# Patient Record
Sex: Male | Born: 1965 | Race: White | Hispanic: No | Marital: Single | Smoking: Current every day smoker
Health system: Southern US, Community
[De-identification: ages and names within clinical notes are randomized; demographics above are authoritative.]

## PROBLEM LIST (undated history)

## (undated) DIAGNOSIS — Q2381 Bicuspid aortic valve: Secondary | ICD-10-CM

## (undated) DIAGNOSIS — Q231 Congenital insufficiency of aortic valve: Secondary | ICD-10-CM

## (undated) DIAGNOSIS — Z720 Tobacco use: Secondary | ICD-10-CM

## (undated) DIAGNOSIS — I4719 Other supraventricular tachycardia: Secondary | ICD-10-CM

## (undated) DIAGNOSIS — R079 Chest pain, unspecified: Secondary | ICD-10-CM

## (undated) DIAGNOSIS — B1920 Unspecified viral hepatitis C without hepatic coma: Secondary | ICD-10-CM

## (undated) DIAGNOSIS — I350 Nonrheumatic aortic (valve) stenosis: Secondary | ICD-10-CM

## (undated) DIAGNOSIS — I471 Supraventricular tachycardia: Secondary | ICD-10-CM

## (undated) HISTORY — DX: Nonrheumatic aortic (valve) stenosis: I35.0

## (undated) HISTORY — DX: Supraventricular tachycardia: I47.1

## (undated) HISTORY — DX: Bicuspid aortic valve: Q23.81

## (undated) HISTORY — DX: Tobacco use: Z72.0

## (undated) HISTORY — DX: Chest pain, unspecified: R07.9

## (undated) HISTORY — DX: Congenital insufficiency of aortic valve: Q23.1

## (undated) HISTORY — DX: Other supraventricular tachycardia: I47.19

---

## 2003-04-25 ENCOUNTER — Other Ambulatory Visit: Payer: Self-pay

## 2005-04-28 ENCOUNTER — Emergency Department: Payer: Self-pay | Admitting: Emergency Medicine

## 2007-02-20 HISTORY — PX: RECONSTRUCTION OF NOSE: SHX2301

## 2007-04-10 ENCOUNTER — Ambulatory Visit: Payer: Self-pay | Admitting: Gastroenterology

## 2007-04-18 ENCOUNTER — Emergency Department: Payer: Self-pay | Admitting: Emergency Medicine

## 2007-09-28 ENCOUNTER — Emergency Department: Payer: Self-pay | Admitting: Emergency Medicine

## 2007-11-06 ENCOUNTER — Emergency Department: Payer: Self-pay | Admitting: Emergency Medicine

## 2010-08-10 ENCOUNTER — Observation Stay: Payer: Self-pay | Admitting: Internal Medicine

## 2010-08-10 DIAGNOSIS — I359 Nonrheumatic aortic valve disorder, unspecified: Secondary | ICD-10-CM

## 2011-05-14 ENCOUNTER — Ambulatory Visit: Payer: Self-pay | Admitting: Internal Medicine

## 2011-10-26 ENCOUNTER — Emergency Department: Payer: Self-pay | Admitting: Emergency Medicine

## 2011-10-26 LAB — URINALYSIS, COMPLETE
Bacteria: NONE SEEN
Bilirubin,UR: NEGATIVE
Blood: NEGATIVE
Glucose,UR: NEGATIVE mg/dL (ref 0–75)
Ketone: NEGATIVE
Leukocyte Esterase: NEGATIVE
RBC,UR: NONE SEEN /HPF (ref 0–5)
Squamous Epithelial: NONE SEEN

## 2011-10-26 LAB — CBC
HCT: 41.2 % (ref 40.0–52.0)
HGB: 14.4 g/dL (ref 13.0–18.0)
MCH: 33.7 pg (ref 26.0–34.0)
MCV: 96 fL (ref 80–100)
RBC: 4.28 10*6/uL — ABNORMAL LOW (ref 4.40–5.90)

## 2011-10-26 LAB — BASIC METABOLIC PANEL
Anion Gap: 10 (ref 7–16)
BUN: 14 mg/dL (ref 7–18)
Calcium, Total: 9 mg/dL (ref 8.5–10.1)
Co2: 24 mmol/L (ref 21–32)
Creatinine: 1.24 mg/dL (ref 0.60–1.30)
EGFR (African American): >60
Glucose: 140 mg/dL — ABNORMAL HIGH (ref 65–99)
Osmolality: 280 (ref 275–301)
Sodium: 139 mmol/L (ref 136–145)

## 2011-10-26 LAB — DRUG SCREEN, URINE
Amphetamines, Ur Screen: NEGATIVE (ref ?–1000)
Cocaine Metabolite,Ur ~~LOC~~: NEGATIVE (ref ?–300)
Methadone, Ur Screen: NEGATIVE (ref ?–300)
Opiate, Ur Screen: NEGATIVE (ref ?–300)
Phencyclidine (PCP) Ur S: NEGATIVE (ref ?–25)
Tricyclic, Ur Screen: POSITIVE (ref ?–1000)

## 2011-10-26 LAB — TROPONIN I: Troponin-I: <0.02 ng/mL

## 2011-11-26 ENCOUNTER — Emergency Department: Payer: Self-pay | Admitting: Emergency Medicine

## 2012-02-20 DIAGNOSIS — B1920 Unspecified viral hepatitis C without hepatic coma: Secondary | ICD-10-CM

## 2012-02-20 HISTORY — DX: Unspecified viral hepatitis C without hepatic coma: B19.20

## 2013-05-26 DIAGNOSIS — R079 Chest pain, unspecified: Secondary | ICD-10-CM | POA: Insufficient documentation

## 2013-05-26 DIAGNOSIS — Q231 Congenital insufficiency of aortic valve: Secondary | ICD-10-CM | POA: Insufficient documentation

## 2013-05-26 DIAGNOSIS — Z720 Tobacco use: Secondary | ICD-10-CM | POA: Insufficient documentation

## 2013-06-15 DIAGNOSIS — I350 Nonrheumatic aortic (valve) stenosis: Secondary | ICD-10-CM | POA: Insufficient documentation

## 2013-06-15 DIAGNOSIS — I471 Supraventricular tachycardia: Secondary | ICD-10-CM | POA: Insufficient documentation

## 2015-02-20 HISTORY — PX: MULTIPLE TOOTH EXTRACTIONS: SHX2053

## 2015-07-22 ENCOUNTER — Encounter: Payer: Self-pay | Admitting: Emergency Medicine

## 2015-07-22 DIAGNOSIS — Y929 Unspecified place or not applicable: Secondary | ICD-10-CM | POA: Insufficient documentation

## 2015-07-22 DIAGNOSIS — S70361A Insect bite (nonvenomous), right thigh, initial encounter: Secondary | ICD-10-CM | POA: Diagnosis present

## 2015-07-22 DIAGNOSIS — M791 Myalgia: Secondary | ICD-10-CM | POA: Diagnosis not present

## 2015-07-22 DIAGNOSIS — Y939 Activity, unspecified: Secondary | ICD-10-CM | POA: Diagnosis not present

## 2015-07-22 DIAGNOSIS — W57XXXA Bitten or stung by nonvenomous insect and other nonvenomous arthropods, initial encounter: Secondary | ICD-10-CM | POA: Insufficient documentation

## 2015-07-22 DIAGNOSIS — Y999 Unspecified external cause status: Secondary | ICD-10-CM | POA: Insufficient documentation

## 2015-07-22 DIAGNOSIS — F17200 Nicotine dependence, unspecified, uncomplicated: Secondary | ICD-10-CM | POA: Insufficient documentation

## 2015-07-22 NOTE — ED Notes (Signed)
Pt states removed tick from right groin 5 days pta. Pt states "i just haven't been feeling good, you know achy and all that." pt with reddened area and what appears to be an imbedded tick head to right upper inner thigh. No rash noted.

## 2015-07-23 ENCOUNTER — Emergency Department
Admission: EM | Admit: 2015-07-23 | Discharge: 2015-07-23 | Disposition: A | Discharge status: Home / Self Care | Payer: BLUE CROSS/BLUE SHIELD | Attending: Emergency Medicine | Admitting: Emergency Medicine

## 2015-07-23 ENCOUNTER — Encounter: Payer: Self-pay | Admitting: Emergency Medicine

## 2015-07-23 DIAGNOSIS — M791 Myalgia, unspecified site: Secondary | ICD-10-CM

## 2015-07-23 DIAGNOSIS — W57XXXA Bitten or stung by nonvenomous insect and other nonvenomous arthropods, initial encounter: Secondary | ICD-10-CM

## 2015-07-23 HISTORY — DX: Unspecified viral hepatitis C without hepatic coma: B19.20

## 2015-07-23 MED ORDER — DOXYCYCLINE HYCLATE 100 MG PO TABS
100.0000 mg | ORAL_TABLET | Freq: Once | ORAL | Status: AC
  Administered 2015-07-23: 100 mg via ORAL
  Filled 2015-07-23: qty 1

## 2015-07-23 MED ORDER — DOXYCYCLINE HYCLATE 100 MG PO CAPS: 100.0000 mg | ORAL_CAPSULE | Freq: Two times a day (BID) | ORAL | Status: DC

## 2015-07-23 MED ORDER — ONDANSETRON 4 MG PO TBDP: 4.0000 mg | ORAL_TABLET | Freq: Four times a day (QID) | ORAL | Status: DC | PRN

## 2015-07-23 NOTE — Discharge Instructions (Signed)
Tick Bite Information Ticks are insects that attach themselves to the skin and draw blood for food. There are various types of ticks. Common types include wood ticks and deer ticks. Most ticks live in shrubs and grassy areas. Ticks can climb onto your body when you make contact with leaves or grass where the tick is waiting. The most common places on the body for ticks to attach themselves are the scalp, neck, armpits, waist, and groin. Most tick bites are harmless, but sometimes ticks carry germs that cause diseases. These germs can be spread to a person during the tick's feeding process. The chance of a disease spreading through a tick bite depends on:   The type of tick.  Time of year.   How long the tick is attached.   Geographic location.  HOW CAN YOU PREVENT TICK BITES? Take these steps to help prevent tick bites when you are outdoors:  Wear protective clothing. Long sleeves and long pants are best.   Wear white clothes so you can see ticks more easily.  Tuck your pant legs into your socks.   If walking on a trail, stay in the middle of the trail to avoid brushing against bushes.  Avoid walking through areas with long grass.  Put insect repellent on all exposed skin and along boot tops, pant legs, and sleeve cuffs.   Check clothing, hair, and skin repeatedly and before going inside.   Brush off any ticks that are not attached.  Take a shower or bath as soon as possible after being outdoors.  WHAT IS THE PROPER WAY TO REMOVE A TICK? Ticks should be removed as soon as possible to help prevent diseases caused by tick bites. 1. If latex gloves are available, put them on before trying to remove a tick.  2. Using fine-point tweezers, grasp the tick as close to the skin as possible. You may also use curved forceps or a tick removal tool. Grasp the tick as close to its head as possible. Avoid grasping the tick on its body. 3. Pull gently with steady upward pressure until  the tick lets go. Do not twist the tick or jerk it suddenly. This may break off the tick's head or mouth parts. 4. Do not squeeze or crush the tick's body. This could force disease-carrying fluids from the tick into your body.  5. After the tick is removed, wash the bite area and your hands with soap and water or other disinfectant such as alcohol. 6. Apply a small amount of antiseptic cream or ointment to the bite site.  7. Wash and disinfect any instruments that were used.  Do not try to remove a tick by applying a hot match, petroleum jelly, or fingernail polish to the tick. These methods do not work and may increase the chances of disease being spread from the tick bite.  WHEN SHOULD YOU SEEK MEDICAL CARE? Contact your health care provider if you are unable to remove a tick from your skin or if a part of the tick breaks off and is stuck in the skin.  After a tick bite, you need to be aware of signs and symptoms that could be related to diseases spread by ticks. Contact your health care provider if you develop any of the following in the days or weeks after the tick bite:  Unexplained fever.  Rash. A circular rash that appears days or weeks after the tick bite may indicate the possibility of Lyme disease. The rash may resemble   a target with a bull's-eye and may occur at a different part of your body than the tick bite.  Redness and swelling in the area of the tick bite.   Tender, swollen lymph glands.   Diarrhea.   Weight loss.   Cough.   Fatigue.   Muscle, joint, or bone pain.   Abdominal pain.   Headache.   Lethargy or a change in your level of consciousness.  Difficulty walking or moving your legs.   Numbness in the legs.   Paralysis.  Shortness of breath.   Confusion.   Repeated vomiting.    This information is not intended to replace advice given to you by your health care provider. Make sure you discuss any questions you have with your health  care provider.   Document Released: 02/03/2000 Document Revised: 02/26/2014 Document Reviewed: 07/16/2012 Elsevier Interactive Patient Education 2016 [employer]  

## 2015-07-23 NOTE — ED Notes (Signed)
Pt reports tick bite to right groin Monday, tick removed, pt states appear head was connected to body.  Pt reports malaise since.

## 2015-07-23 NOTE — ED Provider Notes (Signed)
[facility] Emergency Department Provider Note  ____________________________________________  Time seen: Approximately 9:25 AM  I have reviewed the triage vital signs and the nursing notes.   HISTORY  Chief Complaint Insect Bite    HPI Clarence Long is a 50 y.o. male presents for evaluation of a tick bite.  Patient reports that roughly on Monday he was bitten by a tick, he found it in the head was attached. He removed it but then developed a rash and a slight amount of circular swelling in the right upper leg. This then went away and since then he has been feeling as though he has muscle aches, chills and some fatigue. Denies headache, neck pain, cough, abdominal pain nausea or vomiting.  Patient reports he thinks she might have [facility] spotted fever.  Reports that right now his fevers gone, but in the evening since she is been noticing it more.  Past Medical History  Diagnosis Date  . Hepatitis C     There are no active problems to display for this patient.   History reviewed. No pertinent past surgical history.  Current Outpatient Rx  Name  Route  Sig  Dispense  Refill  . doxycycline (VIBRAMYCIN) 100 MG capsule   Oral   Take 1 capsule (100 mg total) by mouth 2 (two) times daily.   20 capsule   0   . ondansetron (ZOFRAN ODT) 4 MG disintegrating tablet   Oral   Take 1 tablet (4 mg total) by mouth every 6 (six) hours as needed for nausea or vomiting.   20 tablet   0     Allergies Codeine  History reviewed. No pertinent family history.  Social History Social History  Substance Use Topics  . Smoking status: Current Every Day Smoker  . Smokeless tobacco: Never Used  . Alcohol Use: Yes    Review of Systems Constitutional: Fatigue and chills Eyes: No visual changes. ENT: No sore throat. Cardiovascular: Denies chest pain. Respiratory: Denies shortness of breath. Gastrointestinal: No abdominal pain.  No nausea, no  vomiting.  No diarrhea.  No constipation. Genitourinary: Negative for dysuria. Musculoskeletal: Negative for back pain. Skin: Negative for rashNow but he did have one over his right leg. Neurological: Negative for headaches, focal weakness or numbness.  10-point ROS otherwise negative.  ____________________________________________   PHYSICAL EXAM:  VITAL SIGNS: ED Triage Vitals  Enc Vitals Group     BP 07/22/15 2147 129/81 mmHg     Pulse Rate 07/22/15 2147 72     Resp 07/22/15 2147 16     Temp 07/22/15 2147 98.2 F (36.8 C)     Temp Source 07/22/15 2147 Oral     SpO2 07/22/15 2147 98 %     Weight 07/22/15 2147 150 lb (68.04 kg)     Height 07/22/15 2147 6' (1.829 m)     Head Cir --      Peak Flow --      Pain Score 07/22/15 2148 0     Pain Loc --      Pain Edu? --      Excl. in [place]? --    Constitutional: Alert and oriented. Well appearing and in no acute distress. Eyes: Conjunctivae are normal. PERRL. EOMI. Head: Atraumatic. Nose: No congestion/rhinnorhea. Mouth/Throat: Mucous membranes are moist.  Oropharynx non-erythematous. Neck: No stridor.  No meningismus Cardiovascular: Normal rate, regular rhythm. Grossly normal heart sounds.  Good peripheral circulation. Respiratory: Normal respiratory effort.  No retractions. Lungs CTAB. Gastrointestinal: Soft  and nontender. No distention. No abdominal bruits. No CVA tenderness. Musculoskeletal: No lower extremity tenderness nor edema.  No joint effusions. Neurologic:  Normal speech and language. No gross focal neurologic deficits are appreciated. No gait instability. Skin:  Skin is warm, dry and intact. No rash noted. Right thigh examined, full skin exam and no obvious rash at this time. No purpura petechia. Psychiatric: Mood and affect are normal. Speech and behavior are normal.   Patient is very reassuring, normal examination. ____________________________________________   LABS (all labs ordered are listed, but only  abnormal results are displayed)  Labs Reviewed - No data to display ____________________________________________  EKG  ____________________________________________  RADIOLOGY   ____________________________________________   PROCEDURES  Procedure(s) performed: None  Critical Care performed: No  ____________________________________________   INITIAL IMPRESSION / ASSESSMENT AND PLAN / ED COURSE  Pertinent labs & imaging results that were available during my care of the patient were reviewed by me and considered in my medical decision making (see chart for details).  The setting of the patient's symptoms, recent tick bite, and reassuring clinical examination appears most likely the patient having tick borne illness and given our demographic likely [facility] spotted fever. We'll treat him with doxycycline, provide Zofran for hospital nausea as can be associated with doxycycline use. Careful return to questions discuss, patient very agreeable ____________________________________________   FINAL CLINICAL IMPRESSION(S) / ED DIAGNOSES  Final diagnoses:  Tick bite  Myalgia      Delman Kitten, MD 07/23/15 [phone]

## 2015-07-23 NOTE — ED Notes (Signed)
Pt updated on wait time, blanket provided for comfort.

## 2017-02-03 ENCOUNTER — Emergency Department
Admission: EM | Admit: 2017-02-03 | Discharge: 2017-02-03 | Disposition: A | Discharge status: Home / Self Care | Payer: BLUE CROSS/BLUE SHIELD | Attending: Emergency Medicine | Admitting: Emergency Medicine

## 2017-02-03 ENCOUNTER — Emergency Department: Payer: BLUE CROSS/BLUE SHIELD

## 2017-02-03 DIAGNOSIS — F17210 Nicotine dependence, cigarettes, uncomplicated: Secondary | ICD-10-CM | POA: Insufficient documentation

## 2017-02-03 DIAGNOSIS — R55 Syncope and collapse: Secondary | ICD-10-CM | POA: Insufficient documentation

## 2017-02-03 LAB — ETHANOL: Alcohol, Ethyl (B): <10 mg/dL (ref <10)

## 2017-02-03 LAB — CBC WITH DIFFERENTIAL/PLATELET
BASOS ABS: 0 10*3/uL (ref 0–0.1)
BASOS PCT: 0 %
Eosinophils Absolute: 0.3 10*3/uL (ref 0–0.7)
Eosinophils Relative: 4 %
HEMATOCRIT: 38.1 % — AB (ref 40.0–52.0)
HEMOGLOBIN: 12.9 g/dL — AB (ref 13.0–18.0)
Lymphocytes Relative: 32 %
Lymphs Abs: 2.2 10*3/uL (ref 1.0–3.6)
MCH: 32.3 pg (ref 26.0–34.0)
MCHC: 33.9 g/dL (ref 32.0–36.0)
MCV: 95.4 fL (ref 80.0–100.0)
MONOS PCT: 12 %
Monocytes Absolute: 0.9 10*3/uL (ref 0.2–1.0)
NEUTROS ABS: 3.7 10*3/uL (ref 1.4–6.5)
NEUTROS PCT: 52 %
Platelets: 315 10*3/uL (ref 150–440)
RBC: 3.99 MIL/uL — AB (ref 4.40–5.90)
RDW: 13.3 % (ref 11.5–14.5)
WBC: 7.1 10*3/uL (ref 3.8–10.6)

## 2017-02-03 LAB — COMPREHENSIVE METABOLIC PANEL
ALBUMIN: 3.7 g/dL (ref 3.5–5.0)
ALT: 22 U/L (ref 17–63)
ANION GAP: 7 (ref 5–15)
AST: 14 U/L — ABNORMAL LOW (ref 15–41)
Alkaline Phosphatase: 83 U/L (ref 38–126)
BILIRUBIN TOTAL: 0.6 mg/dL (ref 0.3–1.2)
BUN: 14 mg/dL (ref 6–20)
CO2: 23 mmol/L (ref 22–32)
Calcium: 8.9 mg/dL (ref 8.9–10.3)
Chloride: 108 mmol/L (ref 101–111)
Creatinine, Ser: 0.97 mg/dL (ref 0.61–1.24)
GFR calc Af Amer: >60 mL/min (ref >60)
GFR calc non Af Amer: >60 mL/min (ref >60)
GLUCOSE: 83 mg/dL (ref 65–99)
POTASSIUM: 3.8 mmol/L (ref 3.5–5.1)
SODIUM: 138 mmol/L (ref 135–145)
TOTAL PROTEIN: 6.9 g/dL (ref 6.5–8.1)

## 2017-02-03 LAB — URINE DRUG SCREEN, QUALITATIVE (ARMC ONLY)
AMPHETAMINES, UR SCREEN: NOT DETECTED
Barbiturates, Ur Screen: NOT DETECTED
Benzodiazepine, Ur Scrn: POSITIVE — AB
COCAINE METABOLITE, UR ~~LOC~~: POSITIVE — AB
Cannabinoid 50 Ng, Ur ~~LOC~~: NOT DETECTED
MDMA (ECSTASY) UR SCREEN: NOT DETECTED
METHADONE SCREEN, URINE: NOT DETECTED
OPIATE, UR SCREEN: NOT DETECTED
Phencyclidine (PCP) Ur S: NOT DETECTED
Tricyclic, Ur Screen: NOT DETECTED

## 2017-02-03 LAB — TROPONIN I: Troponin I: <0.03 ng/mL (ref <0.03)

## 2017-02-03 MED ORDER — SODIUM CHLORIDE 0.9 % IV BOLUS (SEPSIS)
1000.0000 mL | Freq: Once | INTRAVENOUS | Status: AC
  Administered 2017-02-03: 1000 mL via INTRAVENOUS

## 2017-02-03 NOTE — Discharge Instructions (Signed)
You should make appointments to follow-up with a primary care doctor, a neurologist, and a general surgeon.  Return to the emergency department immediately if you have recurrent passing out, weakness or lightheadedness, chest pain or difficulty breathing, confusion or change in your mental state, persistent or recurrent shaking or any seizure type episodes, or any other new or worsening symptoms that concern you.  You should eat and drink regularly, try to get enough rest and sleep, and avoid heavy alcohol use or any illicit drugs.

## 2017-02-03 NOTE — ED Notes (Signed)
Pt discharged to home.  Family member driving.  Discharge instructions reviewed.  Verbalized understanding.  No questions or concerns at this time.  Teach back verified.  Pt in NAD.  No items left in ED.   

## 2017-02-03 NOTE — ED Provider Notes (Signed)
[facility] Emergency Department Provider Note ____________________________________________   First MD Initiated Contact with Patient 02/03/17 1738     (approximate)  I have reviewed the triage vital signs and the nursing notes.   HISTORY  Chief Complaint Seizures    HPI Clarence Long is a 51 y.o. male who presents with syncope, acute onset when patient states that he was taking clothes out of the dryer, and preceded by several minutes of feeling lightheaded and dizzy.  Patient states that this is happened to him several times in the past, with the most recent time approximately 1 month ago.  He states that he is now back to his baseline.  Per EMS, on their arrival, patient was unresponsive, and was having seizure-like activity especially around his hips and lower extremities.  This lasted for several minutes until they gave 4 of Versed when it resolved, patient awoke and was oriented x3.  Patient denies any prior history of seizures.  He states he drank alcohol last night, but denies any illicit drug use.  Past Medical History:  Diagnosis Date  . Hepatitis C     There are no active problems to display for this patient.   History reviewed. No pertinent surgical history.  Prior to Admission medications   Medication Sig Start Date End Date Taking? Authorizing Provider  doxycycline (VIBRAMYCIN) 100 MG capsule Take 1 capsule (100 mg total) by mouth 2 (two) times daily. 07/23/15   Delman Kitten, MD  ondansetron (ZOFRAN ODT) 4 MG disintegrating tablet Take 1 tablet (4 mg total) by mouth every 6 (six) hours as needed for nausea or vomiting. 07/23/15   Delman Kitten, MD    Allergies Codeine  No family history on file.  Social History Social History   Tobacco Use  . Smoking status: Current Every Day Smoker  . Smokeless tobacco: Never Used  Substance Use Topics  . Alcohol use: Yes  . Drug use: No    Review of Systems  Constitutional: No  fever. Eyes: No visual changes. ENT: No sore throat. Cardiovascular: Denies chest pain. Respiratory: Denies shortness of breath. Gastrointestinal: No nausea, no vomiting.   Genitourinary: Negative for dysuria.  Musculoskeletal: Negative for back pain. Skin: Negative for rash. Neurological: Negative for headache.   ____________________________________________   PHYSICAL EXAM:  VITAL SIGNS: ED Triage Vitals  Enc Vitals Group     BP 02/03/17 1734 116/87     Pulse Rate 02/03/17 1734 78     Resp 02/03/17 1734 18     Temp 02/03/17 1734 98.6 F (37 C)     Temp Source 02/03/17 1734 Oral     SpO2 02/03/17 1734 96 %     Weight 02/03/17 1734 150 lb (68 kg)     Height --      Head Circumference --      Peak Flow --      Pain Score 02/03/17 1733 8     Pain Loc --      Pain Edu? --      Excl. in [place]? --     Constitutional: Alert and oriented.  Slightly tired appearing but in no acute distress. Eyes: Conjunctivae are normal.  EOMI.  PERRLA. Head: Atraumatic. Nose: No congestion/rhinnorhea. Mouth/Throat: Mucous membranes are moist.   Neck: Normal range of motion.  Cardiovascular: Normal rate, regular rhythm. Grossly normal heart sounds.  Good peripheral circulation. Respiratory: Normal respiratory effort.  No retractions. Lungs CTAB. Gastrointestinal: Soft and nontender. No distention.  Genitourinary: No  flank tenderness. Musculoskeletal: No lower extremity edema.  Extremities warm and well perfused.  Neurologic:  Normal speech and language. No gross focal neurologic deficits are appreciated.  Motor and sensory intact in all extremities.  No ataxia. Skin:  Skin is warm and dry. No rash noted. Psychiatric: Mood and affect are normal. Speech and behavior are normal.  ____________________________________________   LABS (all labs ordered are listed, but only abnormal results are displayed)  Labs Reviewed  COMPREHENSIVE METABOLIC PANEL - Abnormal; Notable for the following  components:      Result Value   AST 14 (*)    All other components within normal limits  CBC WITH DIFFERENTIAL/PLATELET - Abnormal; Notable for the following components:   RBC 3.99 (*)    Hemoglobin 12.9 (*)    HCT 38.1 (*)    All other components within normal limits  URINE DRUG SCREEN, QUALITATIVE ([facility] ONLY) - Abnormal; Notable for the following components:   Cocaine Metabolite,Ur [place] POSITIVE (*)    Benzodiazepine, Ur Scrn POSITIVE (*)    All other components within normal limits  ETHANOL  TROPONIN I   ____________________________________________  EKG  ED ECG REPORT I, Arta Silence, the attending physician, personally viewed and interpreted this ECG.  Date: 02/03/2017 EKG Time: 1726 Rate: 82 Rhythm: normal sinus rhythm QRS Axis: normal Intervals: normal ST/T Wave abnormalities: normal Narrative Interpretation: no evidence of acute ischemia; no significant change when compared to EKG of 10/26/2011  ____________________________________________  RADIOLOGY  CT head: No ICH or other acute findings  ____________________________________________   PROCEDURES  Procedure(s) performed: No    Critical Care performed: No ____________________________________________   INITIAL IMPRESSION / ASSESSMENT AND PLAN / ED COURSE  Pertinent labs & imaging results that were available during my care of the patient were reviewed by me and considered in my medical decision making (see chart for details).  51 year old male with past medical history as noted above, and additional history of aortic stenosis as obtained through review of care everywhere, presents with what patient relates as a syncopal type episode with prodrome of several minutes of lightheadedness, followed by him losing consciousness.  EMS however reports that on their arrival patient was unresponsive and had possible seizure-like activity although it was somewhat atypical and he was mainly shaking from the waist  down.  This was present for approximately 15 minutes and resolved after he was given for Versed.  At that time patient awoke and was conversant and alert.  On exam, vital signs are normal, the patient is slightly tired appearing likely after the Versed, but otherwise alert and oriented x3.  Neuro exam was nonfocal, and the remainder the exam was generally unremarkable.  Differential for the syncope includes vasovagal, dehydration, other metabolic cause, or less likely cardiac including possible aortic stenosis although I have a lower suspicion for this given the patient was not exerting himself and had only relatively low-grade aortic stenosis when he was last worked up several years ago.  The etiology of the seizure-like activity is not clear.  Patient has had no recurrent seizure-like activity in the ED, and has no prior history of seizures.  Plan: Basic and cardiac labs, ethanol and UDS, CT head, and reassess.    ----------------------------------------- 9:24 PM on 02/03/2017 -----------------------------------------  Patient is now fully alert, and appears comfortable.  He states he feels much better and feels well to go home.  His vital signs are stable.  The lab workup is unremarkable, and the CT head is negative.  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  I suspect that therefore if patient did have a seizure it could be cocaine related, although overall is more likely the patient had a vasovagal syncope and not a true seizure.  Patient feels comfortable with going home.  I will give him referrals for neurology, primary care, as well as for a general surgeon since patient showed me a lump in his left groin which has been there chronically, and is not painful and nontender and is consistent with an inguinal hernia.    Discharge instructions and return precautions explained verbally to patient, and expresses  understanding.  ____________________________________________   FINAL CLINICAL IMPRESSION(S) / ED DIAGNOSES  Final diagnoses:  Syncope, unspecified syncope type      NEW MEDICATIONS STARTED DURING THIS VISIT:  This SmartLink is deprecated. Use AVSMEDLIST instead to display the medication list for a patient.   Note:  This document was prepared using Dragon voice recognition software and may include unintentional dictation errors.    Arta Silence, MD 02/03/17 2127

## 2017-02-03 NOTE — ED Triage Notes (Signed)
Pt brought from home by [facility].  Pt was found face down in kitchen after having what was described at "seizure from hips down".  Per EMS, they gave a total of 4mg  versed and pt had no postictal moment after versed given.  Per EMS, pt was alert & oriented after.  Pt is A&Ox4, in NAD at this time.  Pt reports frequent dizziness, tingling in his hands and feet.  Pt unsure if he is diabetic.  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  Pt did say that he drank some last night.  Pt denies drug use.

## 2017-02-06 ENCOUNTER — Telehealth: Payer: Self-pay | Admitting: Surgery

## 2017-02-06 NOTE — Telephone Encounter (Signed)
I have called patient to make an appointment per, referral from [facility] ED for lump in his left groin-possible inguinal hernia. No answer. I have left a message on voicemail for patient to call and make an appointment with any of our surgeons.

## 2017-02-14 NOTE — Telephone Encounter (Signed)
Patient has called back and has an appointment with Dr Burt Knack on 03/14/17.

## 2017-03-11 ENCOUNTER — Other Ambulatory Visit: Payer: Self-pay

## 2017-03-14 ENCOUNTER — Ambulatory Visit: Payer: Self-pay | Admitting: Surgery

## 2017-03-25 ENCOUNTER — Ambulatory Visit: Payer: Self-pay | Admitting: Surgery

## 2017-04-08 ENCOUNTER — Ambulatory Visit: Payer: Self-pay | Admitting: Surgery

## 2017-04-11 ENCOUNTER — Encounter: Payer: Self-pay | Admitting: General Surgery

## 2017-04-11 ENCOUNTER — Ambulatory Visit: Payer: 59 | Admitting: General Surgery

## 2017-04-11 VITALS — BP 146/88 | HR 89 | Temp 98.3°F | Ht 73.0 in | Wt 155.2 lb

## 2017-04-11 DIAGNOSIS — K4020 Bilateral inguinal hernia, without obstruction or gangrene, not specified as recurrent: Secondary | ICD-10-CM

## 2017-04-11 DIAGNOSIS — B1920 Unspecified viral hepatitis C without hepatic coma: Secondary | ICD-10-CM | POA: Insufficient documentation

## 2017-04-11 NOTE — Progress Notes (Signed)
Patient ID: Clarence Long, male   DOB: [birth year], 52 y.o.   MRN: [id number]  CC: groin bulge  HPI Clarence Long is a 52 y.o. male who presents to clinic today for evaluation of the left groin bulge.  Patient had the area noted during an ER visit approximately 3 months ago for an unrelated event.  He states since that time the area has become more symptomatic.  He is unsure how long it has been there but is been there months to years he states.  Recently he is noted every time he strains to lift something or bears down hard that the area becomes more tender and he desires to the area fixed as soon as possible.  He reports he is starting a new job in March and wants to be able to fill the job obligations.  He denies any fevers, chills, nausea, vomiting, chest pain, shortness of breath, diarrhea, constipation.  HPI  Past Medical History:  Diagnosis Date  . Atrial tachycardia, paroxysmal ([place])    05/2013  . Bicuspid aortic valve    05/2013  . Calcific aortic stenosis of bicuspid valve    05/2013  . Chest pain    05/2013  . Hepatitis C   . Nicotine abuse     History reviewed. No pertinent surgical history.  Family History  Problem Relation Age of Onset  . Arthritis/Rheumatoid Mother   . Gout Father   . Stroke Paternal Grandmother     Social History Social History   Tobacco Use  . Smoking status: Current Every Day Smoker  . Smokeless tobacco: Never Used  Substance Use Topics  . Alcohol use: Yes  . Drug use: No    Allergies  Allergen Reactions  . Codeine Itching    Other reaction(s): Other (See Comments)    Current Outpatient Medications  Medication Sig Dispense Refill  . albuterol (PROAIR HFA) 108 (90 Base) MCG/ACT inhaler Inhale into the lungs.    . doxycycline (VIBRAMYCIN) 100 MG capsule Take 1 capsule (100 mg total) by mouth 2 (two) times daily. 20 capsule 0  . metoprolol tartrate (LOPRESSOR) 25 MG tablet Take 25 mg by mouth.    . nicotine  (NICODERM CQ - DOSED IN MG/24 HOURS) 14 mg/24hr patch Place onto the skin.    Marland Kitchen ondansetron (ZOFRAN ODT) 4 MG disintegrating tablet Take 1 tablet (4 mg total) by mouth every 6 (six) hours as needed for nausea or vomiting. 20 tablet 0   No current facility-administered medications for this visit.      Review of Systems A multi-point review of systems was asked and was negative except for the findings documented in the HPI  Physical Exam Blood pressure (!) 146/88, pulse 89, temperature 98.3 F (36.8 C), temperature source Oral, height 6\' 1"  (1.854 m), weight 70.4 kg (155 lb 3.2 oz). CONSTITUTIONAL: No acute distress. EYES: Pupils are equal, round, and reactive to light, Sclera are non-icteric. EARS, NOSE, MOUTH AND THROAT: The oropharynx is clear. The oral mucosa is pink and moist. Hearing is intact to voice. LYMPH NODES:  Lymph nodes in the neck are normal. RESPIRATORY:  Lungs are clear. There is normal respiratory effort, with equal breath sounds bilaterally, and without pathologic use of accessory muscles. CARDIOVASCULAR: Heart is regular without murmurs, gallops, or rubs. GI: The abdomen is soft, nontender, and nondistended. There there is a visible left inguinal hernia on standing that is easily reducible.  There is a palpable right inguinal hernia on Valsalva. There is  no hepatosplenomegaly. There are normal bowel sounds in all quadrants. GU: Rectal deferred.   MUSCULOSKELETAL: Normal muscle strength and tone. No cyanosis or edema.   SKIN: Turgor is good and there are no pathologic skin lesions or ulcers. NEUROLOGIC: Motor and sensation is grossly normal. Cranial nerves are grossly intact. PSYCH:  Oriented to person, place and time. Affect is normal.  Data Reviewed Labs are all from December and are within normal limits including a normal white blood cell count and normal electrolytes.  He has an EKG from December that is also sinus rhythm.  He had a urine drug analysis in December  that was positive for cocaine and benzos. I have personally reviewed the patient's imaging, laboratory findings and medical records.    Assessment    Bilateral inguinal hernias    Plan    52 year old male with bilateral inguinal hernias.  Discussed the treatment options of open versus laparoscopic hernia repair.  Discussed that given his bilateral status that a laparoscopic repair is indicated.  Discussed the procedure in detail to include the risk, benefits, alternatives.  He voiced understanding and desires to proceed as soon as possible.  Discussed that the soonest of our time that is available is with my partner, Dr. Hampton Abbot.  Patient voiced understanding and accepts the plan for surgery with Dr. Hampton Abbot next Thursday.  Preoperatively we will obtain labs, chest x-ray, EKG and he notes that he will have to have a urine drug analysis within 24 hours of his operation.     Time spent with the patient was 45 minutes, with more than 50% of the time spent in face-to-face education, counseling and care coordination.     Clayburn Pert, MD FACS General Surgeon 04/11/2017, 2:36 PM

## 2017-04-11 NOTE — Patient Instructions (Addendum)
You have chose to have your hernia's  repaired. This will be done by Dr. Hampton Abbot on 04/18/17 at [facility].  Please see your (blue) Pre-care information that you have been given today.  You will need to arrange to be out of work for 2 weeks and then return with a lifting restrictions for 4 more weeks. Please send any FMLA paperwork prior to surgery and we will fill this out and fax it back to your employer within 3 business days.  You may have a bruise in your groin and also swelling and brusing in your testicle area. You may use ice 4-5 times daily for 15-20 minutes each time. Make sure that you place a barrier between you and the ice pack. To decrease the swelling, you may roll up a bath towel and place it vertically in between your thighs with your testicles resting on the towel. You will want to keep this area elevated as much as possible for several days following surgery.    Inguinal Hernia, Adult Muscles help keep everything in the body in its proper place. But if a weak spot in the muscles develops, something can poke through. That is called a hernia. When this happens in the lower part of the belly (abdomen), it is called an inguinal hernia. (It takes its name from a part of the body in this region called the inguinal canal.) A weak spot in the wall of muscles lets some fat or part of the small intestine bulge through. An inguinal hernia can develop at any age. Men get them more often than women. CAUSES  In adults, an inguinal hernia develops over time.  It can be triggered by:  Suddenly straining the muscles of the lower abdomen.  Lifting heavy objects.  Straining to have a bowel movement. Difficult bowel movements (constipation) can lead to this.  Constant coughing. This may be caused by smoking or lung disease.  Being overweight.  Being pregnant.  Working at a job that requires long periods of standing or heavy lifting.  Having had an inguinal hernia before. One type can be an  emergency situation. It is called a strangulated inguinal hernia. It develops if part of the small intestine slips through the weak spot and cannot get back into the abdomen. The blood supply can be cut off. If that happens, part of the intestine may die. This situation requires emergency surgery. SYMPTOMS  Often, a small inguinal hernia has no symptoms. It is found when a healthcare provider does a physical exam. Larger hernias usually have symptoms.   In adults, symptoms may include:  A lump in the groin. This is easier to see when the person is standing. It might disappear when lying down.  In men, a lump in the scrotum.  Pain or burning in the groin. This occurs especially when lifting, straining or coughing.  A dull ache or feeling of pressure in the groin.  Signs of a strangulated hernia can include:  A bulge in the groin that becomes very painful and tender to the touch.  A bulge that turns red or purple.  Fever, nausea and vomiting.  Inability to have a bowel movement or to pass gas. DIAGNOSIS  To decide if you have an inguinal hernia, a healthcare provider will probably do a physical examination.  This will include asking questions about any symptoms you have noticed.  The healthcare provider might feel the groin area and ask you to cough. If an inguinal hernia is felt, the healthcare  provider may try to slide it back into the abdomen.  Usually no other tests are needed. TREATMENT  Treatments can vary. The size of the hernia makes a difference. Options include:  Watchful waiting. This is often suggested if the hernia is small and you have had no symptoms.  No medical procedure will be done unless symptoms develop.  You will need to watch closely for symptoms. If any occur, contact your healthcare provider right away.  Surgery. This is used if the hernia is larger or you have symptoms.  Open surgery. This is usually an outpatient procedure (you will not stay  overnight in a hospital). An cut (incision) is made through the skin in the groin. The hernia is put back inside the abdomen. The weak area in the muscles is then repaired by herniorrhaphy or hernioplasty. Herniorrhaphy: in this type of surgery, the weak muscles are sewn back together. Hernioplasty: a patch or mesh is used to close the weak area in the abdominal wall.  Laparoscopy. In this procedure, a surgeon makes small incisions. A thin tube with a tiny video camera (called a laparoscope) is put into the abdomen. The surgeon repairs the hernia with mesh by looking with the video camera and using two long instruments. HOME CARE INSTRUCTIONS   After surgery to repair an inguinal hernia:  You will need to take pain medicine prescribed by your healthcare provider. Follow all directions carefully.  You will need to take care of the wound from the incision.  Your activity will be restricted for awhile. This will probably include no heavy lifting for several weeks. You also should not do anything too active for a few weeks. When you can return to work will depend on the type of job that you have.  During "watchful waiting" periods, you should:  Maintain a healthy weight.  Eat a diet high in fiber (fruits, vegetables and whole grains).  Drink plenty of fluids to avoid constipation. This means drinking enough water and other liquids to keep your urine clear or pale yellow.  Do not lift heavy objects.  Do not stand for long periods of time.  Quit smoking. This should keep you from developing a frequent cough. SEEK MEDICAL CARE IF:   A bulge develops in your groin area.  You feel pain, a burning sensation or pressure in the groin. This might be worse if you are lifting or straining.  You develop a fever of more than 100.5 F (38.1 C). SEEK IMMEDIATE MEDICAL CARE IF:   Pain in the groin increases suddenly.  A bulge in the groin gets bigger suddenly and does not go down.  For men, there  is sudden pain in the scrotum. Or, the size of the scrotum increases.  A bulge in the groin area becomes red or purple and is painful to touch.  You have nausea or vomiting that does not go away.  You feel your heart beating much faster than normal.  You cannot have a bowel movement or pass gas.  You develop a fever of more than 102.0 F (38.9 C).   This information is not intended to replace advice given to you by your health care provider. Make sure you discuss any questions you have with your health care provider.   Document Released: 06/24/2008 Document Revised: 04/30/2011 Document Reviewed: 08/09/2014 Elsevier Interactive Patient Education [employer].

## 2017-04-11 NOTE — H&P (View-Only) (Signed)
Patient ID: Clarence Long, male   DOB: Jul 29, 1965, 52 y.o.   MRN: [id number]  CC: groin bulge  HPI Clarence Long is a 52 y.o. male who presents to clinic today for evaluation of the left groin bulge.  Patient had the area noted during an ER visit approximately 3 months ago for an unrelated event.  He states since that time the area has become more symptomatic.  He is unsure how long it has been there but is been there months to years he states.  Recently he is noted every time he strains to lift something or bears down hard that the area becomes more tender and he desires to the area fixed as soon as possible.  He reports he is starting a new job in March and wants to be able to fill the job obligations.  He denies any fevers, chills, nausea, vomiting, chest pain, shortness of breath, diarrhea, constipation.  HPI  Past Medical History:  Diagnosis Date  . Atrial tachycardia, paroxysmal ([place])    05/2013  . Bicuspid aortic valve    05/2013  . Calcific aortic stenosis of bicuspid valve    05/2013  . Chest pain    05/2013  . Hepatitis C   . Nicotine abuse     History reviewed. No pertinent surgical history.  Family History  Problem Relation Age of Onset  . Arthritis/Rheumatoid Mother   . Gout Father   . Stroke Paternal Grandmother     Social History Social History   Tobacco Use  . Smoking status: Current Every Day Smoker  . Smokeless tobacco: Never Used  Substance Use Topics  . Alcohol use: Yes  . Drug use: No    Allergies  Allergen Reactions  . Codeine Itching    Other reaction(s): Other (See Comments)    Current Outpatient Medications  Medication Sig Dispense Refill  . albuterol (PROAIR HFA) 108 (90 Base) MCG/ACT inhaler Inhale into the lungs.    . doxycycline (VIBRAMYCIN) 100 MG capsule Take 1 capsule (100 mg total) by mouth 2 (two) times daily. 20 capsule 0  . metoprolol tartrate (LOPRESSOR) 25 MG tablet Take 25 mg by mouth.    . nicotine  (NICODERM CQ - DOSED IN MG/24 HOURS) 14 mg/24hr patch Place onto the skin.    Marland Kitchen ondansetron (ZOFRAN ODT) 4 MG disintegrating tablet Take 1 tablet (4 mg total) by mouth every 6 (six) hours as needed for nausea or vomiting. 20 tablet 0   No current facility-administered medications for this visit.      Review of Systems A multi-point review of systems was asked and was negative except for the findings documented in the HPI  Physical Exam Blood pressure (!) 146/88, pulse 89, temperature 98.3 F (36.8 C), temperature source Oral, height 6\' 1"  (1.854 m), weight 70.4 kg (155 lb 3.2 oz). CONSTITUTIONAL: No acute distress. EYES: Pupils are equal, round, and reactive to light, Sclera are non-icteric. EARS, NOSE, MOUTH AND THROAT: The oropharynx is clear. The oral mucosa is pink and moist. Hearing is intact to voice. LYMPH NODES:  Lymph nodes in the neck are normal. RESPIRATORY:  Lungs are clear. There is normal respiratory effort, with equal breath sounds bilaterally, and without pathologic use of accessory muscles. CARDIOVASCULAR: Heart is regular without murmurs, gallops, or rubs. GI: The abdomen is soft, nontender, and nondistended. There there is a visible left inguinal hernia on standing that is easily reducible.  There is a palpable right inguinal hernia on Valsalva. There is  no hepatosplenomegaly. There are normal bowel sounds in all quadrants. GU: Rectal deferred.   MUSCULOSKELETAL: Normal muscle strength and tone. No cyanosis or edema.   SKIN: Turgor is good and there are no pathologic skin lesions or ulcers. NEUROLOGIC: Motor and sensation is grossly normal. Cranial nerves are grossly intact. PSYCH:  Oriented to person, place and time. Affect is normal.  Data Reviewed Labs are all from December and are within normal limits including a normal white blood cell count and normal electrolytes.  He has an EKG from December that is also sinus rhythm.  He had a urine drug analysis in December  that was positive for cocaine and benzos. I have personally reviewed the patient's imaging, laboratory findings and medical records.    Assessment    Bilateral inguinal hernias    Plan    52 year old male with bilateral inguinal hernias.  Discussed the treatment options of open versus laparoscopic hernia repair.  Discussed that given his bilateral status that a laparoscopic repair is indicated.  Discussed the procedure in detail to include the risk, benefits, alternatives.  He voiced understanding and desires to proceed as soon as possible.  Discussed that the soonest of our time that is available is with my partner, Dr. Hampton Abbot.  Patient voiced understanding and accepts the plan for surgery with Dr. Hampton Abbot next Thursday.  Preoperatively we will obtain labs, chest x-ray, EKG and he notes that he will have to have a urine drug analysis within 24 hours of his operation.     Time spent with the patient was 45 minutes, with more than 50% of the time spent in face-to-face education, counseling and care coordination.     Clayburn Pert, MD FACS General Surgeon 04/11/2017, 2:36 PM

## 2017-04-12 ENCOUNTER — Telehealth: Payer: Self-pay | Admitting: Surgery

## 2017-04-12 NOTE — Telephone Encounter (Signed)
Pt advised of pre op date/time and sx date. Sx: 04/18/17 with Dr Piscoya--laparoscopic bilateral inguinal hernia repair.  Pre op: 04/15/17 @ 2:30pm--office interview.   Patient made aware to call [phone], between 1-3:00pm the day before surgery, to find out what time to arrive.

## 2017-04-15 ENCOUNTER — Other Ambulatory Visit: Payer: Self-pay

## 2017-04-15 ENCOUNTER — Encounter
Admission: RE | Admit: 2017-04-15 | Discharge: 2017-04-15 | Disposition: A | Discharge status: Home / Self Care | Payer: 59 | Source: Ambulatory Visit | Attending: Surgery | Admitting: Surgery

## 2017-04-15 ENCOUNTER — Ambulatory Visit
Admission: RE | Admit: 2017-04-15 | Discharge: 2017-04-15 | Disposition: A | Discharge status: Home / Self Care | Payer: 59 | Source: Ambulatory Visit | Attending: General Surgery | Admitting: General Surgery

## 2017-04-15 ENCOUNTER — Ambulatory Visit: Payer: Self-pay | Admitting: Surgery

## 2017-04-15 DIAGNOSIS — K4020 Bilateral inguinal hernia, without obstruction or gangrene, not specified as recurrent: Secondary | ICD-10-CM | POA: Diagnosis not present

## 2017-04-15 DIAGNOSIS — Z01818 Encounter for other preprocedural examination: Secondary | ICD-10-CM | POA: Insufficient documentation

## 2017-04-15 DIAGNOSIS — Z01810 Encounter for preprocedural cardiovascular examination: Secondary | ICD-10-CM

## 2017-04-15 LAB — BASIC METABOLIC PANEL
Anion gap: 6 (ref 5–15)
BUN: 12 mg/dL (ref 6–20)
CO2: 25 mmol/L (ref 22–32)
CREATININE: 0.94 mg/dL (ref 0.61–1.24)
Calcium: 8.9 mg/dL (ref 8.9–10.3)
Chloride: 104 mmol/L (ref 101–111)
GFR calc Af Amer: >60 mL/min (ref >60)
Glucose, Bld: 99 mg/dL (ref 65–99)
POTASSIUM: 4.2 mmol/L (ref 3.5–5.1)
SODIUM: 135 mmol/L (ref 135–145)

## 2017-04-15 LAB — CBC WITH DIFFERENTIAL/PLATELET
BASOS ABS: 0.2 10*3/uL — AB (ref 0–0.1)
Basophils Relative: 3 %
EOS ABS: 0.4 10*3/uL (ref 0–0.7)
Eosinophils Relative: 5 %
HCT: 40.1 % (ref 40.0–52.0)
Hemoglobin: 13.5 g/dL (ref 13.0–18.0)
Lymphocytes Relative: 22 %
Lymphs Abs: 2 10*3/uL (ref 1.0–3.6)
MCH: 32.5 pg (ref 26.0–34.0)
MCHC: 33.5 g/dL (ref 32.0–36.0)
MCV: 96.8 fL (ref 80.0–100.0)
Monocytes Absolute: 1.1 10*3/uL — ABNORMAL HIGH (ref 0.2–1.0)
Monocytes Relative: 11 %
Neutro Abs: 5.6 10*3/uL (ref 1.4–6.5)
Neutrophils Relative %: 59 %
PLATELETS: 244 10*3/uL (ref 150–440)
RBC: 4.15 MIL/uL — AB (ref 4.40–5.90)
RDW: 14.2 % (ref 11.5–14.5)
WBC: 9.3 10*3/uL (ref 3.8–10.6)

## 2017-04-15 MED ORDER — CHLORHEXIDINE GLUCONATE CLOTH 2 % EX PADS
6.0000 | MEDICATED_PAD | Freq: Once | CUTANEOUS | Status: DC
  Filled 2017-04-15: qty 6

## 2017-04-15 NOTE — Patient Instructions (Signed)
Your procedure is scheduled on: Thursday, February 28  Report to [facility]  To find out your arrival time please call [phone] between 1PM - 3PM on Wednesday, February 27  Remember: Instructions that are not followed completely may result in serious medical risk,  up to and including death, or upon the discretion of your surgeon and anesthesiologist your  surgery may need to be rescheduled.     _X__ 1. Do not eat food after midnight the night before your procedure.                 No gum chewing or hard candies.                  You may drink clear liquids up to 2 hours                 before you are scheduled to arrive for your surgery-                   DO not drink clear                 liquids within 2 hours of the start of your surgery.                  Clear Liquids include:  water, apple juice without pulp, clear carbohydrate                 drink such as Clearfast of Gartorade, Black Coffee or Tea (Do not add                 anything to coffee or tea).  __X__2.  On the morning of surgery brush your teeth with toothpaste and water,                         you may rinse your mouth with mouthwash if you wish.                               Do not swallow any toothpaste of mouthwash.     _X__ 3.  No Alcohol for 24 hours before or after surgery.   _X__ 4.  Do Not Smoke or use e-cigarettes For 24 Hours Prior to Your Surgery.                 Do not use any chewable tobacco products for at least 6 hours prior to                 surgery.  ____  5.  Bring all medications with you on the day of surgery if instructed.   ____  6.  Notify your doctor if there is any change in your medical condition      (cold, fever, infections).     Do not wear jewelry, make-up, hairpins, clips or nail polish. Do not wear lotions, powders, or perfumes. You may wear deodorant. Do not shave 48 hours prior to surgery. Men may shave face and  neck. Do not bring valuables to the hospital.    [facility] is not responsible for any belongings or valuables.  Contacts, dentures or bridgework may not be worn into surgery. Leave your suitcase in the car. After surgery it may be brought to your room. For patients admitted to the hospital, discharge time is determined by your treatment team.  Patients discharged the day of surgery will not be allowed to drive home.   Please read over the following fact sheets that you were given:   [place]   ____ Take these medicines the morning of surgery with A SIP OF WATER:    1. NONE  2.   3.   4.    ____ Fleet Enema (as directed)   _X___ Use CHG Soap as directed  ____ Use inhalers on the day of surgery   ___ Stop ALL ASPIRIN PRODUCTS NOW!!                This includes excedrin / goodys powder/ bc powder  ____ Stop Anti-inflammatories NOW!!                This includes ibuprofen / advil / motrin / aleve                    YOU MAY TAKE TYLENOL FOR ACHES AND PAINS             ____ Stop supplements until after surgery.    ____ Bring C-Pap to the hospital.   HAVE STOOL SOFTENERS AVAILABLE AT HOME.  [place]

## 2017-04-17 MED ORDER — CEFAZOLIN SODIUM-DEXTROSE 2-4 GM/100ML-% IV SOLN
2.0000 g | INTRAVENOUS | Status: AC
  Administered 2017-04-18: 2 g via INTRAVENOUS

## 2017-04-18 ENCOUNTER — Ambulatory Visit
Admission: RE | Admit: 2017-04-18 | Discharge: 2017-04-18 | Disposition: A | Discharge status: Home / Self Care | Payer: No Typology Code available for payment source | Source: Ambulatory Visit | Attending: Surgery | Admitting: Surgery

## 2017-04-18 ENCOUNTER — Encounter
Admission: RE | Disposition: A | Discharge status: Home / Self Care | Payer: Self-pay | Source: Ambulatory Visit | Attending: Surgery

## 2017-04-18 ENCOUNTER — Encounter: Payer: Self-pay | Admitting: *Deleted

## 2017-04-18 ENCOUNTER — Ambulatory Visit: Payer: No Typology Code available for payment source | Admitting: Certified Registered Nurse Anesthetist

## 2017-04-18 DIAGNOSIS — K4020 Bilateral inguinal hernia, without obstruction or gangrene, not specified as recurrent: Secondary | ICD-10-CM | POA: Diagnosis not present

## 2017-04-18 DIAGNOSIS — I471 Supraventricular tachycardia: Secondary | ICD-10-CM | POA: Diagnosis not present

## 2017-04-18 DIAGNOSIS — D176 Benign lipomatous neoplasm of spermatic cord: Secondary | ICD-10-CM | POA: Insufficient documentation

## 2017-04-18 DIAGNOSIS — B1920 Unspecified viral hepatitis C without hepatic coma: Secondary | ICD-10-CM | POA: Diagnosis not present

## 2017-04-18 DIAGNOSIS — F17200 Nicotine dependence, unspecified, uncomplicated: Secondary | ICD-10-CM | POA: Diagnosis not present

## 2017-04-18 DIAGNOSIS — Z79899 Other long term (current) drug therapy: Secondary | ICD-10-CM | POA: Insufficient documentation

## 2017-04-18 DIAGNOSIS — Q231 Congenital insufficiency of aortic valve: Secondary | ICD-10-CM | POA: Diagnosis not present

## 2017-04-18 DIAGNOSIS — Z885 Allergy status to narcotic agent status: Secondary | ICD-10-CM | POA: Diagnosis not present

## 2017-04-18 HISTORY — PX: INGUINAL HERNIA REPAIR: SHX194

## 2017-04-18 LAB — URINE DRUG SCREEN, QUALITATIVE (ARMC ONLY)
Amphetamines, Ur Screen: NOT DETECTED
BARBITURATES, UR SCREEN: NOT DETECTED
Benzodiazepine, Ur Scrn: NOT DETECTED
CANNABINOID 50 NG, UR ~~LOC~~: NOT DETECTED
Cocaine Metabolite,Ur ~~LOC~~: NOT DETECTED
MDMA (Ecstasy)Ur Screen: NOT DETECTED
Methadone Scn, Ur: NOT DETECTED
Opiate, Ur Screen: NOT DETECTED
Phencyclidine (PCP) Ur S: NOT DETECTED
TRICYCLIC, UR SCREEN: NOT DETECTED

## 2017-04-18 SURGERY — REPAIR, HERNIA, INGUINAL, BILATERAL, LAPAROSCOPIC
Anesthesia: General | Laterality: Bilateral

## 2017-04-18 MED ORDER — FENTANYL CITRATE (PF) 100 MCG/2ML IJ SOLN
25.0000 ug | INTRAMUSCULAR | Status: DC | PRN
  Administered 2017-04-18 (×2): 25 ug via INTRAVENOUS
  Administered 2017-04-18: 50 ug via INTRAVENOUS

## 2017-04-18 MED ORDER — EPHEDRINE SULFATE 50 MG/ML IJ SOLN
INTRAMUSCULAR | Status: DC | PRN
  Administered 2017-04-18 (×2): 5 mg via INTRAVENOUS

## 2017-04-18 MED ORDER — FAMOTIDINE 20 MG PO TABS
20.0000 mg | ORAL_TABLET | Freq: Once | ORAL | Status: AC
  Administered 2017-04-18: 20 mg via ORAL

## 2017-04-18 MED ORDER — FENTANYL CITRATE (PF) 100 MCG/2ML IJ SOLN
INTRAMUSCULAR | Status: DC | PRN
  Administered 2017-04-18 (×6): 25 ug via INTRAVENOUS
  Administered 2017-04-18: 50 ug via INTRAVENOUS

## 2017-04-18 MED ORDER — ONDANSETRON HCL 4 MG/2ML IJ SOLN
INTRAMUSCULAR | Status: DC | PRN
  Administered 2017-04-18: 4 mg via INTRAVENOUS

## 2017-04-18 MED ORDER — SEVOFLURANE IN SOLN
RESPIRATORY_TRACT | Status: AC
  Filled 2017-04-18: qty 250

## 2017-04-18 MED ORDER — OXYCODONE HCL 5 MG PO TABS
ORAL_TABLET | ORAL | Status: AC
  Filled 2017-04-18: qty 1

## 2017-04-18 MED ORDER — PROPOFOL 10 MG/ML IV BOLUS
INTRAVENOUS | Status: DC | PRN
  Administered 2017-04-18: 30 mg via INTRAVENOUS
  Administered 2017-04-18: 200 mg via INTRAVENOUS

## 2017-04-18 MED ORDER — ROCURONIUM BROMIDE 50 MG/5ML IV SOLN
INTRAVENOUS | Status: AC
  Filled 2017-04-18: qty 1

## 2017-04-18 MED ORDER — FENTANYL CITRATE (PF) 100 MCG/2ML IJ SOLN
INTRAMUSCULAR | Status: AC
  Filled 2017-04-18: qty 2

## 2017-04-18 MED ORDER — PROPOFOL 10 MG/ML IV BOLUS
INTRAVENOUS | Status: AC
  Filled 2017-04-18: qty 20

## 2017-04-18 MED ORDER — IBUPROFEN 600 MG PO TABS: 600.0000 mg | ORAL_TABLET | Freq: Three times a day (TID) | ORAL | 0 refills | Status: DC | PRN

## 2017-04-18 MED ORDER — PHENYLEPHRINE HCL 10 MG/ML IJ SOLN
INTRAMUSCULAR | Status: DC | PRN
  Administered 2017-04-18 (×2): 100 ug via INTRAVENOUS
  Administered 2017-04-18: 50 ug via INTRAVENOUS

## 2017-04-18 MED ORDER — BUPIVACAINE-EPINEPHRINE (PF) 0.5% -1:200000 IJ SOLN
INTRAMUSCULAR | Status: AC
  Filled 2017-04-18: qty 30

## 2017-04-18 MED ORDER — BUPIVACAINE-EPINEPHRINE 0.25% -1:200000 IJ SOLN
INTRAMUSCULAR | Status: DC | PRN
  Administered 2017-04-18: 30 mL

## 2017-04-18 MED ORDER — LACTATED RINGERS IV SOLN
INTRAVENOUS | Status: DC
  Administered 2017-04-18 (×2): via INTRAVENOUS

## 2017-04-18 MED ORDER — KETOROLAC TROMETHAMINE 30 MG/ML IJ SOLN
INTRAMUSCULAR | Status: AC
  Filled 2017-04-18: qty 1

## 2017-04-18 MED ORDER — SUCCINYLCHOLINE CHLORIDE 20 MG/ML IJ SOLN
INTRAMUSCULAR | Status: AC
  Filled 2017-04-18: qty 1

## 2017-04-18 MED ORDER — PHENYLEPHRINE HCL 10 MG/ML IJ SOLN
INTRAMUSCULAR | Status: AC
  Filled 2017-04-18: qty 1

## 2017-04-18 MED ORDER — VASOPRESSIN 20 UNIT/ML IV SOLN
INTRAVENOUS | Status: AC
  Filled 2017-04-18: qty 1

## 2017-04-18 MED ORDER — ONDANSETRON HCL 4 MG/2ML IJ SOLN
INTRAMUSCULAR | Status: AC
  Filled 2017-04-18: qty 2

## 2017-04-18 MED ORDER — PROMETHAZINE HCL 25 MG/ML IJ SOLN: 6.2500 mg | INTRAMUSCULAR | Status: DC | PRN

## 2017-04-18 MED ORDER — EPHEDRINE SULFATE 50 MG/ML IJ SOLN
INTRAMUSCULAR | Status: AC
  Filled 2017-04-18: qty 2

## 2017-04-18 MED ORDER — KETOROLAC TROMETHAMINE 30 MG/ML IJ SOLN
INTRAMUSCULAR | Status: DC | PRN
  Administered 2017-04-18: 30 mg via INTRAVENOUS

## 2017-04-18 MED ORDER — DEXAMETHASONE SODIUM PHOSPHATE 10 MG/ML IJ SOLN
INTRAMUSCULAR | Status: DC | PRN
  Administered 2017-04-18: 5 mg via INTRAVENOUS

## 2017-04-18 MED ORDER — LIDOCAINE HCL (PF) 2 % IJ SOLN
INTRAMUSCULAR | Status: AC
  Filled 2017-04-18: qty 10

## 2017-04-18 MED ORDER — LIDOCAINE HCL (CARDIAC) 20 MG/ML IV SOLN
INTRAVENOUS | Status: DC | PRN
  Administered 2017-04-18: 60 mg via INTRAVENOUS

## 2017-04-18 MED ORDER — SODIUM CHLORIDE FLUSH 0.9 % IV SOLN
INTRAVENOUS | Status: AC
  Filled 2017-04-18: qty 10

## 2017-04-18 MED ORDER — OXYCODONE HCL 5 MG PO TABS: 5.0000 mg | ORAL_TABLET | Freq: Four times a day (QID) | ORAL | 0 refills | Status: DC | PRN

## 2017-04-18 MED ORDER — BUPIVACAINE LIPOSOME 1.3 % IJ SUSP
INTRAMUSCULAR | Status: AC
  Filled 2017-04-18: qty 20

## 2017-04-18 MED ORDER — DEXAMETHASONE SODIUM PHOSPHATE 10 MG/ML IJ SOLN
INTRAMUSCULAR | Status: AC
  Filled 2017-04-18: qty 1

## 2017-04-18 MED ORDER — MIDAZOLAM HCL 2 MG/2ML IJ SOLN
INTRAMUSCULAR | Status: AC
  Filled 2017-04-18: qty 2

## 2017-04-18 MED ORDER — MIDAZOLAM HCL 2 MG/2ML IJ SOLN
INTRAMUSCULAR | Status: DC | PRN
  Administered 2017-04-18: 2 mg via INTRAVENOUS

## 2017-04-18 MED ORDER — FAMOTIDINE 20 MG PO TABS
ORAL_TABLET | ORAL | Status: AC
  Filled 2017-04-18: qty 1

## 2017-04-18 MED ORDER — OXYCODONE HCL 5 MG PO TABS
5.0000 mg | ORAL_TABLET | Freq: Four times a day (QID) | ORAL | Status: DC | PRN
  Administered 2017-04-18: 5 mg via ORAL

## 2017-04-18 MED ORDER — CEFAZOLIN SODIUM-DEXTROSE 2-4 GM/100ML-% IV SOLN
INTRAVENOUS | Status: AC
  Filled 2017-04-18: qty 100

## 2017-04-18 MED ORDER — FENTANYL CITRATE (PF) 100 MCG/2ML IJ SOLN
INTRAMUSCULAR | Status: AC
  Administered 2017-04-18: 25 ug via INTRAVENOUS
  Filled 2017-04-18: qty 2

## 2017-04-18 SURGICAL SUPPLY — 38 items
CHLORAPREP W/TINT 26ML (MISCELLANEOUS) ×3 IMPLANT
DERMABOND ADVANCED (GAUZE/BANDAGES/DRESSINGS) ×2
DERMABOND ADVANCED .7 DNX12 (GAUZE/BANDAGES/DRESSINGS) ×1 IMPLANT
DRAIN PENROSE 1/4X12 LTX (DRAIN) ×3 IMPLANT
GLOVE SURG SYN 7.0 (GLOVE) ×3 IMPLANT
GLOVE SURG SYN 7.5  E (GLOVE) ×2
GLOVE SURG SYN 7.5 E (GLOVE) ×1 IMPLANT
GOWN STRL REUS W/ TWL LRG LVL3 (GOWN DISPOSABLE) ×2 IMPLANT
GOWN STRL REUS W/TWL LRG LVL3 (GOWN DISPOSABLE) ×4
HANDLE YANKAUER SUCT BULB TIP (MISCELLANEOUS) ×3 IMPLANT
IRRIGATION STRYKERFLOW (MISCELLANEOUS) IMPLANT
IRRIGATOR STRYKERFLOW (MISCELLANEOUS)
LABEL OR SOLS (LABEL) ×3 IMPLANT
MESH MARLEX PLUG MEDIUM (Mesh General) ×6 IMPLANT
NEEDLE HYPO 22GX1.5 SAFETY (NEEDLE) ×3 IMPLANT
NS IRRIG 500ML POUR BTL (IV SOLUTION) ×3 IMPLANT
PACK LAP CHOLECYSTECTOMY (MISCELLANEOUS) ×3 IMPLANT
PENCIL ELECTRO HAND CTR (MISCELLANEOUS) ×3 IMPLANT
SCISSORS METZENBAUM CVD 33 (INSTRUMENTS) IMPLANT
SLEEVE ADV FIXATION 5X100MM (TROCAR) IMPLANT
SPONGE LAP 18X18 5 PK (GAUZE/BANDAGES/DRESSINGS) ×3 IMPLANT
SUT MNCRL 4-0 (SUTURE) ×2
SUT MNCRL 4-0 27XMFL (SUTURE) ×1
SUT PROLENE 2 0 SH DA (SUTURE) ×12 IMPLANT
SUT SILK 2 0 (SUTURE) ×2
SUT SILK 2-0 18XBRD TIE 12 (SUTURE) ×1 IMPLANT
SUT VIC AB 0 CT1 36 (SUTURE) ×6 IMPLANT
SUT VIC AB 3-0 SH 27 (SUTURE) ×6
SUT VIC AB 3-0 SH 27X BRD (SUTURE) ×3 IMPLANT
SUT VICRYL 0 AB UR-6 (SUTURE) ×3 IMPLANT
SUTURE MNCRL 4-0 27XMF (SUTURE) ×1 IMPLANT
SYR BULB IRRIG 60ML STRL (SYRINGE) ×3 IMPLANT
TACKER 5MM HERNIA 3.5CML NAB (ENDOMECHANICALS) IMPLANT
TRAY FOLEY W/METER SILVER 16FR (SET/KITS/TRAYS/PACK) ×3 IMPLANT
TROCAR BLUNT TIP 12MM OMST12BT (TROCAR) IMPLANT
TROCAR Z-THREAD OPTICAL 5X100M (TROCAR) IMPLANT
TUBING INSUFFLATION (TUBING) ×3 IMPLANT
WATER STERILE IRR 1000ML POUR (IV SOLUTION) ×3 IMPLANT

## 2017-04-18 NOTE — Anesthesia Procedure Notes (Signed)
Procedure Name: LMA Insertion Date/Time: 04/18/2017 9:17 AM Performed by: Hedda Slade, CRNA Pre-anesthesia Checklist: Patient identified, Patient being monitored, Timeout performed, Emergency Drugs available and Suction available Patient Re-evaluated:Patient Re-evaluated prior to induction Oxygen Delivery Method: Circle system utilized Preoxygenation: Pre-oxygenation with 100% oxygen Induction Type: IV induction Ventilation: Mask ventilation without difficulty LMA: LMA inserted LMA Size: 4.5 Tube type: Oral Number of attempts: 1 Placement Confirmation: positive ETCO2 and breath sounds checked- equal and bilateral Tube secured with: Tape Dental Injury: Teeth and Oropharynx as per pre-operative assessment

## 2017-04-18 NOTE — Interval H&P Note (Signed)
History and Physical Interval Note:  04/18/2017 8:47 AM  Clarence Long.  has presented today for surgery, with the diagnosis of bilateral inguinal hernia  The various methods of treatment have been discussed with the patient and family. After consideration of risks, benefits and other options for treatment, the patient has consented to bilateral open inguinal hernia repair as a surgical intervention .  The patient's history has been reviewed, patient examined, no change in status, stable for surgery.  I have reviewed the patient's chart and labs.  Questions were answered to the patient's satisfaction.     Clarence Long

## 2017-04-18 NOTE — Anesthesia Postprocedure Evaluation (Signed)
Anesthesia Post Note  Patient: Clarence Long.  Procedure(s) Performed: OPEN BILATERAL INGUINAL HERNIA REPAIR (Bilateral )  Patient location during evaluation: PACU Anesthesia Type: General Level of consciousness: awake and alert Pain management: pain level controlled Vital Signs Assessment: post-procedure vital signs reviewed and stable Respiratory status: spontaneous breathing, nonlabored ventilation, respiratory function stable and patient connected to nasal cannula oxygen Cardiovascular status: blood pressure returned to baseline and stable Postop Assessment: no apparent nausea or vomiting Anesthetic complications: no     Last Vitals:  Vitals:   04/18/17 1257 04/18/17 1347  BP: 111/65 110/67  Pulse: 78 77  Resp: 18 16  Temp: 36.7 C   SpO2: 98% 100%    Last Pain:  Vitals:   04/18/17 1347  TempSrc:   PainSc: 3                  Martha Clan

## 2017-04-18 NOTE — Discharge Instructions (Signed)

## 2017-04-18 NOTE — Op Note (Signed)
Procedure Date:  04/18/2017  Pre-operative Diagnosis:  Bilateral inguinal hernias  Post-operative Diagnosis:  Bilateral inguinal hernias  Procedure:  Open bilateral Inguinal Hernia Repair with mesh  Surgeon:  Melvyn Neth, MD  Anesthesia:  General endotracheal  Estimated Blood Loss:  20 ml  Specimens:  Bilateral cord lipomas  Complications:  None  Indications for Procedure:  This is a 52 y.o. male who presents with bilateral inguinal hernias, left more symptomatic than right.  The options of surgery versus observation were reviewed with the patient and/or family. The risks of bleeding, abscess or infection, recurrence of symptoms, potential for an open procedure, injury to surrounding structures, and chronic pain were all discussed with the patient and was willing to proceed.  Description of Procedure: The patient was correctly identified in the preoperative area and brought into the operating room.  The patient was placed supine with VTE prophylaxis in place.  Appropriate time-outs were performed.  Anesthesia was induced and the patient was intubated.  Foley catheter was placed.  Appropriate antibiotics were infused.  The lower abdomen was prepped and draped in a sterile fashion.  We started with the left side.  An 8 cm oblique incision was made between the left ASIS and the pubic tubercle.  Cautery was used to dissect down the subcutaneous tissue to the external oblique aponeurosis.  A small cut was made with scalpel over the aponeurosis and this was extended both medially and laterally with Metzenbaum scissors. The apneourosis was dissected free revealing the inguinal canal.  The cord structures were encircled with a penrose drain and then the cord structures were dissected free from surrounding cremasterics and cord lipoma.  The cord lipoma was resected and sent as a specimen.  The hernia sac was small and was pushed back into the internal ring opening.  A medium size plug was  secured to the internal ring opening.  A medium size mesh was then placed, securing it to the pubic tubercle, the shelving edge, and the conjoined tendon.  The tails were secured together overlapping, creating a new internal ring opening without choking the cord structures.  The cavity was irrigated thoroughly and the aponeurosis was closed using 0 Vicryl suture  30 ml of Exparel solution combined with 0.5% bupivacaine with epi were infused onto the fascia, subcutaneous tissue, and skin.  The wound was then closed in two layers using 3-0 Vicryl and 4-0 Monocryl.  We then moved to the right side.  Again an 8 cm incision was made and cautery used to dissect down to the aponeurosis.  The aponeurosis was opened and the cord structures encircled using penrose drain.  A right cord lipoma was also resected.  No internal hernia was noted and no hernia sac was found, but the patient did have weakening of the direct space at the floor of the inguinal canal.  No plug was used, but a medium size mesh was secured to the pubic tubercle, the shelving edge, and the conjoined tendon covering the direct space.  The tails were overlapped behind the cord without choking it.  All layers were closed in same fashion and another 30 mL of local anesthetic was infused too.  The wounds were cleaned and sealed with DermaBond.  Foley catheter was removed and the patient was emerged from anesthesia and extubated and brought to the recovery room for further management.  The patient tolerated the procedure well and all counts were correct at the end of the case.   P[place],  MD

## 2017-04-18 NOTE — Anesthesia Preprocedure Evaluation (Signed)
Anesthesia Evaluation  Patient identified by MRN, date of birth, ID band Patient awake    Reviewed: Allergy & Precautions, H&P , NPO status , Patient's Chart, lab work & pertinent test results, reviewed documented beta blocker date and time   History of Anesthesia Complications Negative for: history of anesthetic complications  Airway Mallampati: II  TM Distance: >3 FB Neck ROM: full    Dental  (+) Edentulous Upper, Poor Dentition, Dental Advidsory Given   Pulmonary neg shortness of breath, neg COPD, neg recent URI, Current Smoker,           Cardiovascular Exercise Tolerance: Good (-) hypertension(-) angina(-) CAD, (-) Past MI, (-) Cardiac Stents and (-) CABG + dysrhythmias (Atrial tachycardia) + Valvular Problems/Murmurs (Bicuspid aortic valve) AS      Neuro/Psych negative neurological ROS  negative psych ROS   GI/Hepatic negative GI ROS, (+) Hepatitis - (s/p treatment), C  Endo/Other  negative endocrine ROS  Renal/GU negative Renal ROS  negative genitourinary   Musculoskeletal   Abdominal   Peds  Hematology negative hematology ROS (+)   Anesthesia Other Findings Past Medical History: No date: Atrial tachycardia, paroxysmal (HCC)     Comment:  05/2013 No date: Bicuspid aortic valve     Comment:  05/2013 No date: Calcific aortic stenosis of bicuspid valve     Comment:  05/2013 No date: Chest pain     Comment:  05/2013 2014: Hepatitis C     Comment:  treated at that time No date: Nicotine abuse   Reproductive/Obstetrics negative OB ROS                             Anesthesia Physical Anesthesia Plan  ASA: II  Anesthesia Plan: General   Post-op Pain Management:    Induction: Intravenous  PONV Risk Score and Plan: 1 and Ondansetron and Dexamethasone  Airway Management Planned: Oral ETT  Additional Equipment:   Intra-op Plan:   Post-operative Plan: Extubation in OR  Informed  Consent: I have reviewed the patients History and Physical, chart, labs and discussed the procedure including the risks, benefits and alternatives for the proposed anesthesia with the patient or authorized representative who has indicated his/her understanding and acceptance.   Dental Advisory Given  Plan Discussed with: Anesthesiologist, CRNA and Surgeon  Anesthesia Plan Comments:         Anesthesia Quick Evaluation

## 2017-04-18 NOTE — Transfer of Care (Signed)
Immediate Anesthesia Transfer of Care Note  Patient: Clarence Long.  Procedure(s) Performed: OPEN BILATERAL INGUINAL HERNIA REPAIR (Bilateral )  Patient Location: PACU  Anesthesia Type:General  Level of Consciousness: awake, alert  and oriented  Airway & Oxygen Therapy: Patient Spontanous Breathing  Post-op Assessment: Report given to RN and Post -op Vital signs reviewed and stable  Post vital signs: Reviewed and stable  Last Vitals:  Vitals:   04/18/17 1210 04/18/17 1211  BP: 114/79 114/79  Pulse: 91   Resp: 18 19  Temp: 36.6 C 36.6 C  SpO2: 97% 97%    Last Pain:  Vitals:   04/18/17 0743  TempSrc: Oral  PainSc: 1          Complications: No apparent anesthesia complications

## 2017-04-18 NOTE — Anesthesia Post-op Follow-up Note (Signed)
Anesthesia QCDR form completed.        

## 2017-04-19 ENCOUNTER — Telehealth: Payer: Self-pay

## 2017-04-19 ENCOUNTER — Other Ambulatory Visit: Payer: Self-pay

## 2017-04-19 DIAGNOSIS — K4020 Bilateral inguinal hernia, without obstruction or gangrene, not specified as recurrent: Secondary | ICD-10-CM

## 2017-04-19 LAB — SURGICAL PATHOLOGY

## 2017-04-19 MED ORDER — HYDROCODONE-ACETAMINOPHEN 10-325 MG PO TABS: 1.0000 | ORAL_TABLET | Freq: Four times a day (QID) | ORAL | 0 refills | Status: DC | PRN

## 2017-04-19 MED ORDER — HYDROCODONE-ACETAMINOPHEN 5-325 MG PO TABS: 1.0000 | ORAL_TABLET | Freq: Four times a day (QID) | ORAL | 0 refills | Status: DC | PRN

## 2017-04-19 NOTE — Telephone Encounter (Signed)
Patient's cousin called and stated that the pain medication Oxycodone was causing him to be sick. She stated that the ibuprofen was not helping with his pain and wanted to know if they could get another pain medication instead. I verbalized that I would have to contact Dr. Hampton Abbot and give her a call back once he gives me an answer. She verbalized understanding.   Call made to Dr. Hampton Abbot I verbalized to him that the current pain medication is causing the patient to be sick and wants to know if he can get something else. He verbalized that I could place an order for Hydrocodone and that he would come over to sign the prescription. I verbalized understanding.

## 2017-04-19 NOTE — Telephone Encounter (Signed)
Call made to [facility] and verbalized to her that I have the medication ready for her to pick up for the patient. She verbalized understanding and stated that she will be by to pick up it up.

## 2017-04-22 ENCOUNTER — Encounter: Payer: Self-pay | Admitting: Surgery

## 2017-05-01 ENCOUNTER — Telehealth: Payer: Self-pay | Admitting: Surgery

## 2017-05-01 NOTE — Telephone Encounter (Signed)
Patients calling again he wants to know if he could be seen sooner, due to him being able to get around good and is wanting a letter saying he can go back to work sooner.please call and advise.

## 2017-05-01 NOTE — Telephone Encounter (Signed)
Patient called and left a message yesterday at 4:47pm asking for one of the nurses to give him a call. Please call patient and advise.

## 2017-05-01 NOTE — Telephone Encounter (Signed)
Patient is requesting post operative appointment be moved to 05/06/17 due to wanting a note to return to work. Patient scheduled for 05/06/16 @ 3:30 pm Dr.Piscoya.

## 2017-05-06 ENCOUNTER — Ambulatory Visit (INDEPENDENT_AMBULATORY_CARE_PROVIDER_SITE_OTHER): Payer: 59 | Admitting: Surgery

## 2017-05-06 ENCOUNTER — Encounter: Payer: Self-pay | Admitting: Surgery

## 2017-05-06 VITALS — BP 116/76 | HR 73 | Temp 97.8°F | Ht 72.0 in | Wt 148.0 lb

## 2017-05-06 DIAGNOSIS — Z09 Encounter for follow-up examination after completed treatment for conditions other than malignant neoplasm: Secondary | ICD-10-CM

## 2017-05-06 NOTE — Progress Notes (Signed)
05/06/2017  HPI: Patient is status post bilateral open inguinal hernia repairs on 2/28.  He presents today for follow-up.  Reports that he has been doing well without any worsening pain, recurrence, or wound issues.  He has not needed any narcotics anymore for pain control.  He is eating well, with no constipation and good appetite.  Vital signs: BP 116/76   Pulse 73   Temp 97.8 F (36.6 C) (Oral)   Ht 6' (1.829 m)   Wt 67.1 kg (148 lb)   BMI 20.07 kg/m    Physical Exam: Constitutional: No acute distress Abdomen: Soft, nondistended, with mild soreness to palpation bilaterally over each incision.  Both incisions are clean dry and intact and healing well.  There is some firmness underneath each incision consistent with current healing.  No evidence of recurrence or hematomas or seromas.  Assessment/Plan: 52 year old male status post open bilateral inguinal hernia repairs.  -Discussed with patient that he still has no heavy lifting restriction until April 1.  He may return to work now with lighter duties until April 1.  We will write him a note for this. -Patient is healing well without any complications.  Did discuss with him return precautions.  Otherwise he may return to [place] on an as-needed basis.   Melvyn Neth, [place]

## 2017-05-06 NOTE — Patient Instructions (Signed)

## 2017-05-07 ENCOUNTER — Telehealth: Payer: Self-pay | Admitting: Surgery

## 2017-05-07 ENCOUNTER — Encounter: Payer: 59 | Admitting: Surgery

## 2017-05-07 NOTE — Telephone Encounter (Signed)
Spoke with patient  that he could not return to work without restrictions at this time. Patient stated he understood.

## 2017-05-07 NOTE — Telephone Encounter (Signed)
Patient called and states he is not in pain and not taking pain medications. His restrictions are until 4/1 but he would like to return to work this week. Please advise

## 2017-05-08 ENCOUNTER — Encounter: Payer: 59 | Admitting: Surgery

## 2017-05-17 ENCOUNTER — Telehealth: Payer: Self-pay | Admitting: Surgery

## 2017-05-17 NOTE — Telephone Encounter (Signed)
Patient called and left a voicemail asking for one of the nurses to give him a call. Its regarding his job. Please call patient and advise.

## 2018-11-05 IMAGING — CT CT HEAD W/O CM
3 series · 15 of 47 positions shown, 18 images · non-contrast
Comparison: 10/26/2011

CLINICAL DATA: Patient was found face down and kitchen after
seizure.

EXAM:
CT HEAD WITHOUT CONTRAST
TECHNIQUE: Contiguous axial images were obtained from the base of the skull
through the vertex without intravenous contrast.

[Series 2: head wo · axial · 0.47mm/px · z∈[-115,+15]mm · 9 of 32 slices shown, 12 images]
[im 3/32  brain]
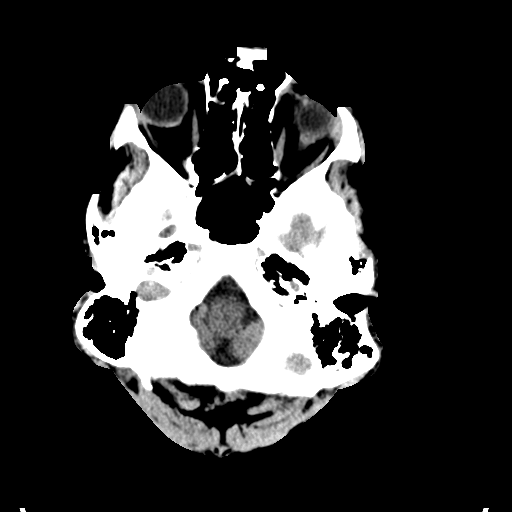
[im 3/32  bone]
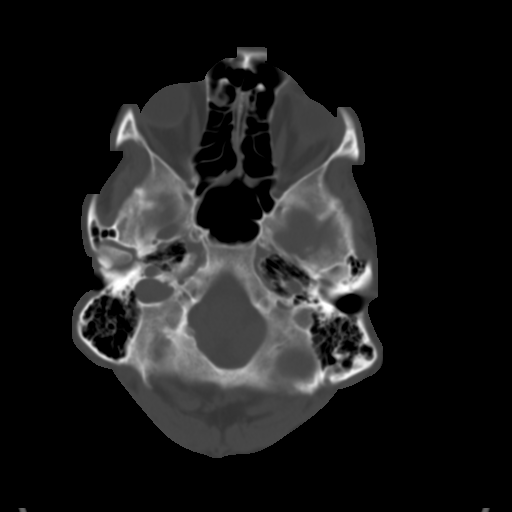
[im 6/32  brain]
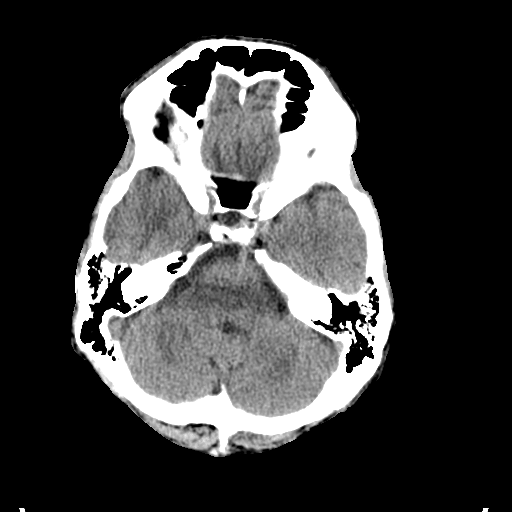
[im 9/32  brain]
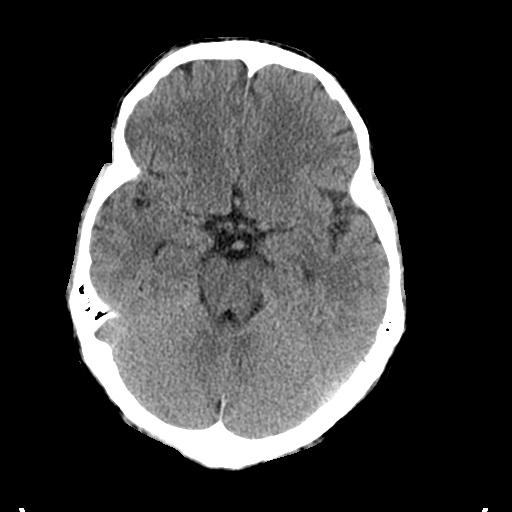
[im 12/32  brain]
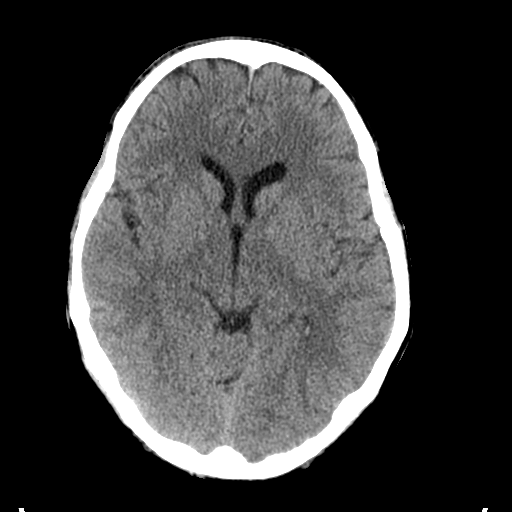
[im 17/32  brain]
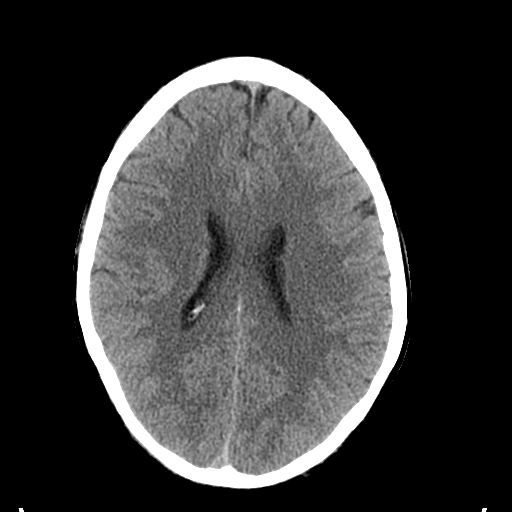
[im 17/32  bone]
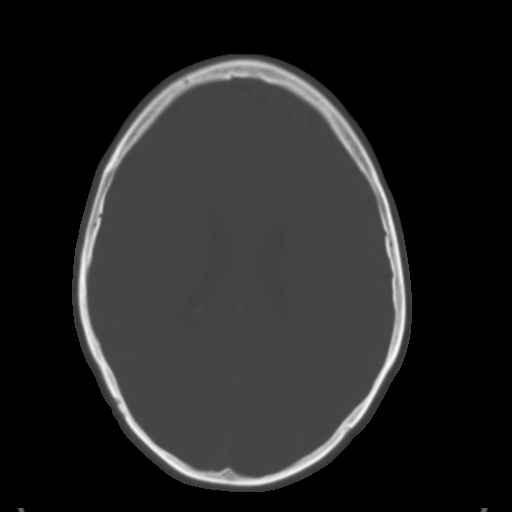
[im 20/32  brain]
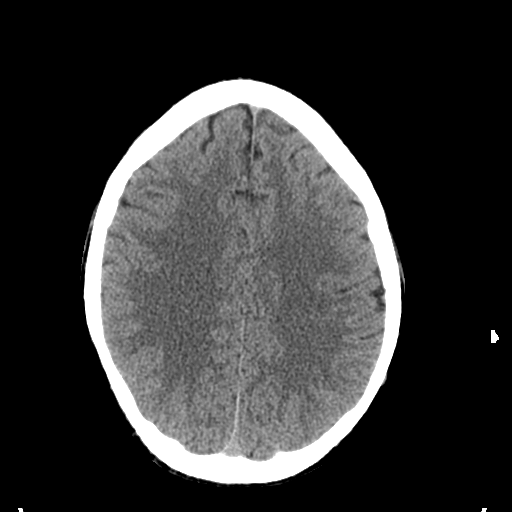
[im 23/32  brain]
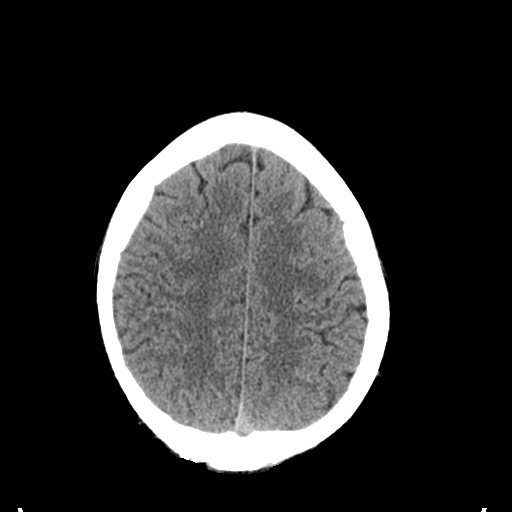
[im 26/32  brain]
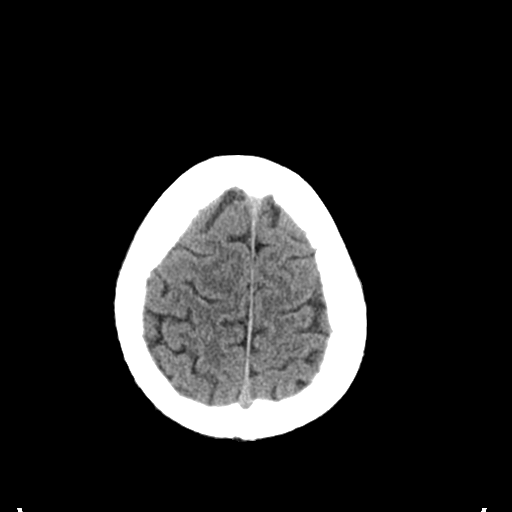
[im 29/32  brain]
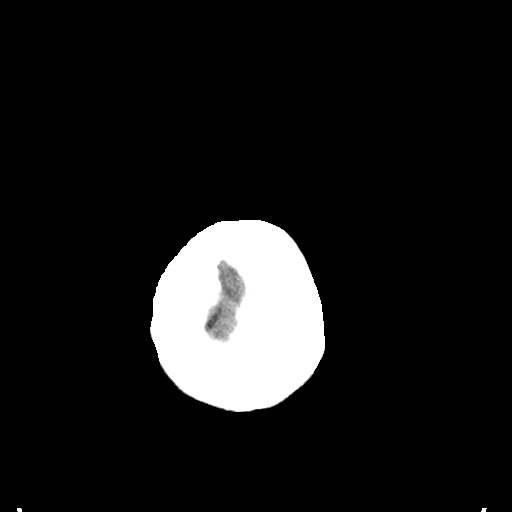
[im 29/32  bone]
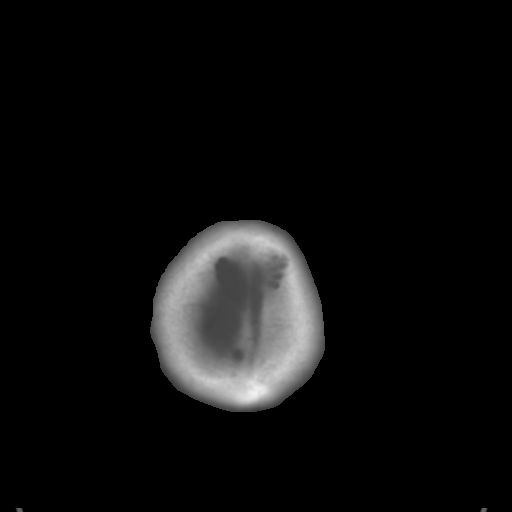

[Series 4: coronal soft tissue · coronal · 0.31mm/px · 3 of 74 slices shown]
[im 25/74  brain]
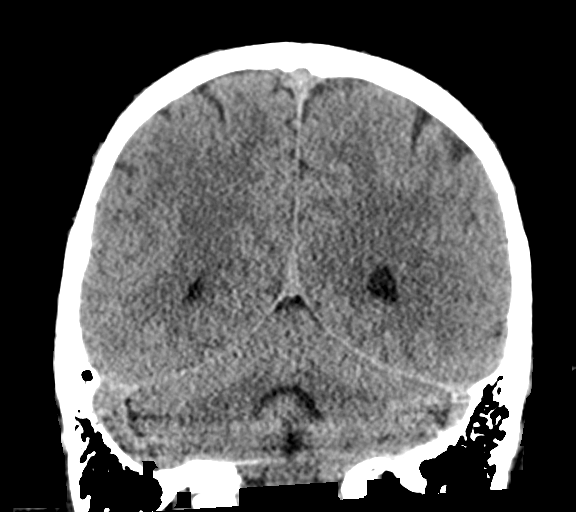
[im 33/74  brain]
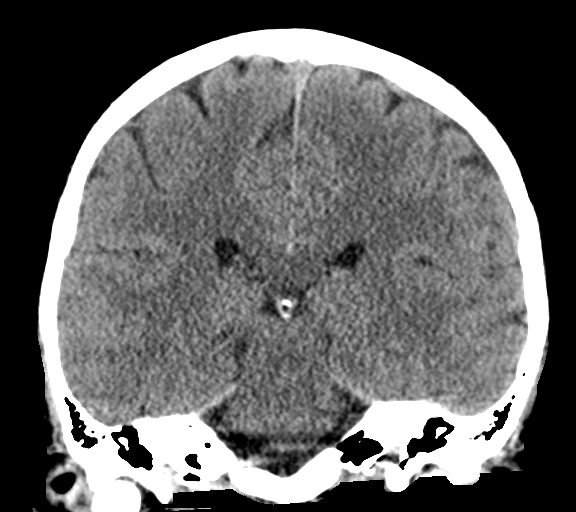
[im 41/74  brain]
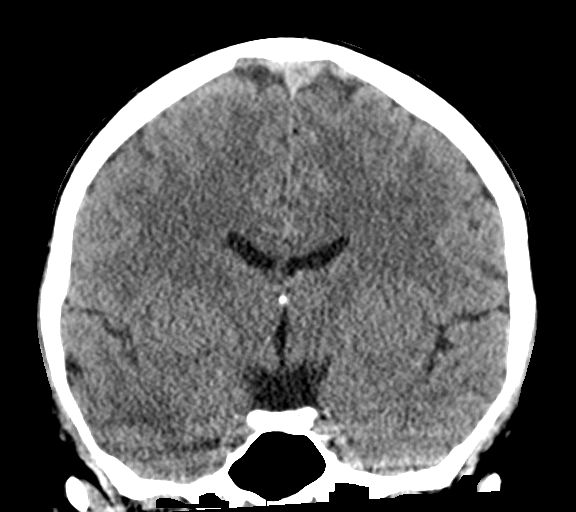

[Series 5: sagittal soft tissue · sagittal · 0.30mm/px · 3 of 61 slices shown]
[im 21/61  brain]
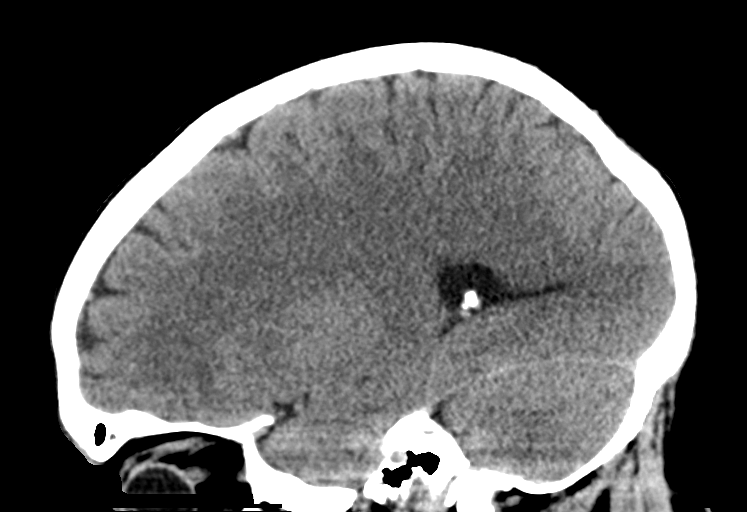
[im 31/61  brain]
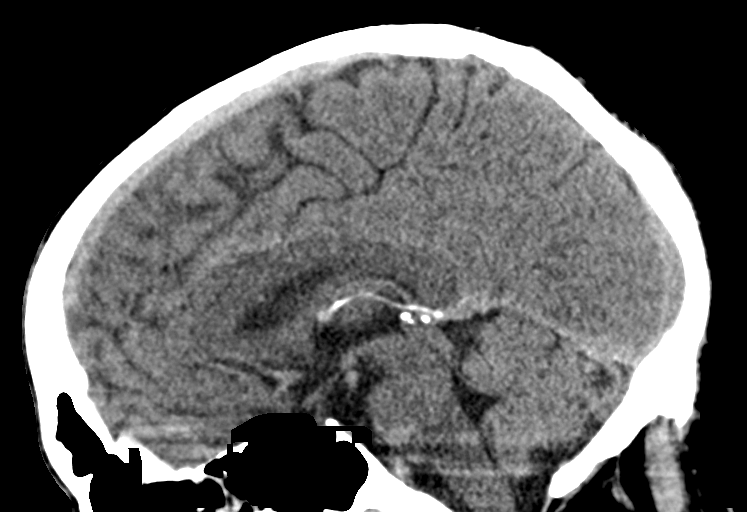
[im 41/61  brain]
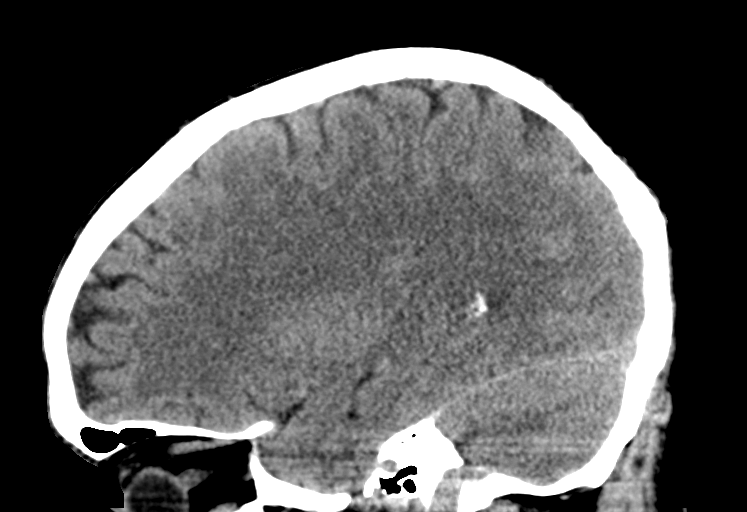

[15 of 47 positions shown; findings below may reference images not displayed]

FINDINGS: BRAIN: The ventricles and sulci are normal. No intraparenchymal
hemorrhage, mass effect nor midline shift. No acute large vascular
territory infarcts. Grey-white matter distinction is maintained. The
basal ganglia are unremarkable. No abnormal extra-axial fluid
collections. Basal cisterns are not effaced and midline. The
brainstem and cerebellar hemispheres are without acute
abnormalities.

VASCULAR: No hyperdense vessels. Mild atherosclerosis of the carotid
siphons.

SKULL/SOFT TISSUES: No skull fracture. No significant soft tissue
swelling.

ORBITS/SINUSES: The included ocular globes and orbital contents are
normal.The mastoid air cells are clear. Mild ethmoid, right
maxillary and sphenoid sinus mucosal thickening.

OTHER: None.
IMPRESSION: Normal head CT apart from mild paranasal sinus mucosal thickening.

## 2021-06-06 ENCOUNTER — Other Ambulatory Visit: Payer: Self-pay

## 2021-06-06 ENCOUNTER — Encounter: Payer: Self-pay | Admitting: Emergency Medicine

## 2021-06-06 ENCOUNTER — Observation Stay (HOSPITAL_BASED_OUTPATIENT_CLINIC_OR_DEPARTMENT_OTHER)
Admit: 2021-06-06 | Discharge: 2021-06-06 | Disposition: A | Discharge status: Home / Self Care | Payer: Self-pay | Attending: Hospitalist | Admitting: Hospitalist

## 2021-06-06 ENCOUNTER — Observation Stay
Admission: EM | Admit: 2021-06-06 | Discharge: 2021-06-07 | Disposition: A | Discharge status: Home / Self Care | Payer: Self-pay | Attending: Internal Medicine | Admitting: Internal Medicine

## 2021-06-06 ENCOUNTER — Emergency Department: Payer: Self-pay

## 2021-06-06 ENCOUNTER — Observation Stay: Payer: Self-pay

## 2021-06-06 ENCOUNTER — Encounter
Admission: EM | Disposition: A | Discharge status: Home / Self Care | Payer: Self-pay | Source: Home / Self Care | Attending: Emergency Medicine

## 2021-06-06 DIAGNOSIS — Z79899 Other long term (current) drug therapy: Secondary | ICD-10-CM | POA: Insufficient documentation

## 2021-06-06 DIAGNOSIS — R079 Chest pain, unspecified: Secondary | ICD-10-CM | POA: Diagnosis present

## 2021-06-06 DIAGNOSIS — I213 ST elevation (STEMI) myocardial infarction of unspecified site: Principal | ICD-10-CM | POA: Insufficient documentation

## 2021-06-06 DIAGNOSIS — F17210 Nicotine dependence, cigarettes, uncomplicated: Secondary | ICD-10-CM | POA: Insufficient documentation

## 2021-06-06 DIAGNOSIS — F1410 Cocaine abuse, uncomplicated: Secondary | ICD-10-CM

## 2021-06-06 DIAGNOSIS — Z20822 Contact with and (suspected) exposure to covid-19: Secondary | ICD-10-CM | POA: Insufficient documentation

## 2021-06-06 DIAGNOSIS — R9431 Abnormal electrocardiogram [ECG] [EKG]: Secondary | ICD-10-CM

## 2021-06-06 DIAGNOSIS — Q2381 Bicuspid aortic valve: Secondary | ICD-10-CM

## 2021-06-06 DIAGNOSIS — Q231 Congenital insufficiency of aortic valve: Secondary | ICD-10-CM

## 2021-06-06 HISTORY — PX: LEFT HEART CATH AND CORONARY ANGIOGRAPHY: CATH118249

## 2021-06-06 LAB — PROTIME-INR
INR: 1 (ref 0.8–1.2)
Prothrombin Time: 12.8 seconds (ref 11.4–15.2)

## 2021-06-06 LAB — TROPONIN I (HIGH SENSITIVITY)
Troponin I (High Sensitivity): 4 ng/L (ref <18)
Troponin I (High Sensitivity): 8 ng/L (ref <18)

## 2021-06-06 LAB — COMPREHENSIVE METABOLIC PANEL
ALT: 30 U/L (ref 0–44)
AST: 18 U/L (ref 15–41)
Albumin: 4 g/dL (ref 3.5–5.0)
Alkaline Phosphatase: 69 U/L (ref 38–126)
Anion gap: 6 (ref 5–15)
BUN: 15 mg/dL (ref 6–20)
CO2: 25 mmol/L (ref 22–32)
Calcium: 9.1 mg/dL (ref 8.9–10.3)
Chloride: 106 mmol/L (ref 98–111)
Creatinine, Ser: 1.05 mg/dL (ref 0.61–1.24)
GFR, Estimated: >60 mL/min (ref >60)
Glucose, Bld: 97 mg/dL (ref 70–99)
Potassium: 4.3 mmol/L (ref 3.5–5.1)
Sodium: 137 mmol/L (ref 135–145)
Total Bilirubin: 0.4 mg/dL (ref 0.3–1.2)
Total Protein: 7.4 g/dL (ref 6.5–8.1)

## 2021-06-06 LAB — APTT: aPTT: 29 seconds (ref 24–36)

## 2021-06-06 LAB — CBC WITH DIFFERENTIAL/PLATELET
Abs Immature Granulocytes: 0.01 10*3/uL (ref 0.00–0.07)
Basophils Absolute: 0.2 10*3/uL — ABNORMAL HIGH (ref 0.0–0.1)
Basophils Relative: 2 %
Eosinophils Absolute: 0.2 10*3/uL (ref 0.0–0.5)
Eosinophils Relative: 2 %
HCT: 42.8 % (ref 39.0–52.0)
Hemoglobin: 14.3 g/dL (ref 13.0–17.0)
Immature Granulocytes: 0 %
Lymphocytes Relative: 24 %
Lymphs Abs: 1.9 10*3/uL (ref 0.7–4.0)
MCH: 32.1 pg (ref 26.0–34.0)
MCHC: 33.4 g/dL (ref 30.0–36.0)
MCV: 96.2 fL (ref 80.0–100.0)
Monocytes Absolute: 0.9 10*3/uL (ref 0.1–1.0)
Monocytes Relative: 12 %
Neutro Abs: 4.7 10*3/uL (ref 1.7–7.7)
Neutrophils Relative %: 60 %
Platelets: 227 10*3/uL (ref 150–400)
RBC: 4.45 MIL/uL (ref 4.22–5.81)
RDW: 12.6 % (ref 11.5–15.5)
WBC: 7.8 10*3/uL (ref 4.0–10.5)
nRBC: 0 % (ref 0.0–0.2)

## 2021-06-06 LAB — GLUCOSE, CAPILLARY
Glucose-Capillary: 106 mg/dL — ABNORMAL HIGH (ref 70–99)
Glucose-Capillary: 118 mg/dL — ABNORMAL HIGH (ref 70–99)

## 2021-06-06 LAB — URINE DRUG SCREEN, QUALITATIVE (ARMC ONLY)
Amphetamines, Ur Screen: NOT DETECTED
Barbiturates, Ur Screen: NOT DETECTED
Benzodiazepine, Ur Scrn: POSITIVE — AB
Cannabinoid 50 Ng, Ur ~~LOC~~: NOT DETECTED
Cocaine Metabolite,Ur ~~LOC~~: POSITIVE — AB
MDMA (Ecstasy)Ur Screen: NOT DETECTED
Methadone Scn, Ur: NOT DETECTED
Opiate, Ur Screen: NOT DETECTED
Phencyclidine (PCP) Ur S: NOT DETECTED
Tricyclic, Ur Screen: NOT DETECTED

## 2021-06-06 LAB — ECHOCARDIOGRAM COMPLETE
AR max vel: 5.64 cm2
AV Area VTI: 5.69 cm2
AV Area mean vel: 5.69 cm2
AV Mean grad: 1 mmHg
AV Peak grad: 2.1 mmHg
Ao pk vel: 0.73 m/s
Area-P 1/2: 3.1 cm2
Calc EF: 56 %
Height: 72 in
MV VTI: 4.32 cm2
S' Lateral: 3.27 cm
Single Plane A2C EF: 62.1 %
Single Plane A4C EF: 56.1 %
Weight: 2546.75 oz

## 2021-06-06 LAB — MRSA NEXT GEN BY PCR, NASAL: MRSA by PCR Next Gen: NOT DETECTED

## 2021-06-06 LAB — RESP PANEL BY RT-PCR (FLU A&B, COVID) ARPGX2
Influenza A by PCR: NEGATIVE
Influenza B by PCR: NEGATIVE
SARS Coronavirus 2 by RT PCR: NEGATIVE

## 2021-06-06 LAB — LIPID PANEL
Cholesterol: 196 mg/dL (ref 0–200)
HDL: 55 mg/dL (ref >40)
LDL Cholesterol: 124 mg/dL — ABNORMAL HIGH (ref 0–99)
Total CHOL/HDL Ratio: 3.6 RATIO
Triglycerides: 86 mg/dL (ref <150)
VLDL: 17 mg/dL (ref 0–40)

## 2021-06-06 LAB — SEDIMENTATION RATE: Sed Rate: 58 mm/hr — ABNORMAL HIGH (ref 0–20)

## 2021-06-06 LAB — C-REACTIVE PROTEIN: CRP: <0.5 mg/dL (ref <1.0)

## 2021-06-06 LAB — HEMOGLOBIN A1C
Hgb A1c MFr Bld: 5.7 % — ABNORMAL HIGH (ref 4.8–5.6)
Mean Plasma Glucose: 116.89 mg/dL

## 2021-06-06 SURGERY — LEFT HEART CATH AND CORONARY ANGIOGRAPHY
Anesthesia: Moderate Sedation

## 2021-06-06 MED ORDER — SODIUM CHLORIDE 0.9 % IV SOLN: Freq: Once | INTRAVENOUS | Status: DC

## 2021-06-06 MED ORDER — LABETALOL HCL 5 MG/ML IV SOLN: 10.0000 mg | INTRAVENOUS | Status: AC | PRN

## 2021-06-06 MED ORDER — SODIUM CHLORIDE 0.9 % IV SOLN: INTRAVENOUS | Status: DC

## 2021-06-06 MED ORDER — SODIUM CHLORIDE 0.9 % IV SOLN: 250.0000 mL | INTRAVENOUS | Status: DC | PRN

## 2021-06-06 MED ORDER — FENTANYL CITRATE (PF) 100 MCG/2ML IJ SOLN
INTRAMUSCULAR | Status: DC | PRN
  Administered 2021-06-06: 25 ug via INTRAVENOUS

## 2021-06-06 MED ORDER — IBUPROFEN 400 MG PO TABS
600.0000 mg | ORAL_TABLET | Freq: Three times a day (TID) | ORAL | Status: DC
  Administered 2021-06-06: 600 mg via ORAL
  Filled 2021-06-06: qty 2

## 2021-06-06 MED ORDER — IOHEXOL 350 MG/ML SOLN
100.0000 mL | Freq: Once | INTRAVENOUS | Status: AC | PRN
  Administered 2021-06-06: 100 mL via INTRAVENOUS

## 2021-06-06 MED ORDER — SODIUM CHLORIDE 0.9% FLUSH: 3.0000 mL | INTRAVENOUS | Status: DC | PRN

## 2021-06-06 MED ORDER — MIDAZOLAM HCL 2 MG/2ML IJ SOLN
INTRAMUSCULAR | Status: DC | PRN
  Administered 2021-06-06: 1 mg via INTRAVENOUS

## 2021-06-06 MED ORDER — HEPARIN (PORCINE) IN NACL 1000-0.9 UT/500ML-% IV SOLN
INTRAVENOUS | Status: DC | PRN
  Administered 2021-06-06 (×2): 500 mL

## 2021-06-06 MED ORDER — MIDAZOLAM HCL 2 MG/2ML IJ SOLN
INTRAMUSCULAR | Status: AC
  Filled 2021-06-06: qty 2

## 2021-06-06 MED ORDER — ENOXAPARIN SODIUM 40 MG/0.4ML IJ SOSY
40.0000 mg | PREFILLED_SYRINGE | INTRAMUSCULAR | Status: DC
  Administered 2021-06-06: 40 mg via SUBCUTANEOUS
  Filled 2021-06-06: qty 0.4

## 2021-06-06 MED ORDER — FENTANYL CITRATE (PF) 100 MCG/2ML IJ SOLN
INTRAMUSCULAR | Status: AC
  Filled 2021-06-06: qty 2

## 2021-06-06 MED ORDER — FENTANYL CITRATE PF 50 MCG/ML IJ SOSY: 50.0000 ug | PREFILLED_SYRINGE | Freq: Once | INTRAMUSCULAR | Status: AC

## 2021-06-06 MED ORDER — LIDOCAINE HCL (PF) 1 % IJ SOLN
INTRAMUSCULAR | Status: DC | PRN
  Administered 2021-06-06: 2 mL

## 2021-06-06 MED ORDER — ACETAMINOPHEN 325 MG PO TABS
650.0000 mg | ORAL_TABLET | ORAL | Status: DC | PRN
  Administered 2021-06-06: 650 mg via ORAL
  Filled 2021-06-06: qty 2

## 2021-06-06 MED ORDER — FENTANYL CITRATE PF 50 MCG/ML IJ SOSY
PREFILLED_SYRINGE | INTRAMUSCULAR | Status: AC
  Administered 2021-06-06: 50 ug via INTRAVENOUS
  Filled 2021-06-06: qty 1

## 2021-06-06 MED ORDER — ASPIRIN 81 MG PO CHEW
81.0000 mg | CHEWABLE_TABLET | Freq: Every day | ORAL | Status: DC
  Administered 2021-06-07: 81 mg via ORAL
  Filled 2021-06-06: qty 1

## 2021-06-06 MED ORDER — HEPARIN SODIUM (PORCINE) 1000 UNIT/ML IJ SOLN
INTRAMUSCULAR | Status: AC
  Filled 2021-06-06: qty 10

## 2021-06-06 MED ORDER — ONDANSETRON HCL 4 MG/2ML IJ SOLN
4.0000 mg | Freq: Once | INTRAMUSCULAR | Status: AC
Start: 2021-06-06 — End: 2021-06-06

## 2021-06-06 MED ORDER — ONDANSETRON HCL 4 MG/2ML IJ SOLN
4.0000 mg | Freq: Four times a day (QID) | INTRAMUSCULAR | Status: DC | PRN
Start: 2021-06-06 — End: 2021-06-07

## 2021-06-06 MED ORDER — ONDANSETRON HCL 4 MG/2ML IJ SOLN
INTRAMUSCULAR | Status: AC
  Administered 2021-06-06: 4 mg via INTRAVENOUS
  Filled 2021-06-06: qty 2

## 2021-06-06 MED ORDER — HYDRALAZINE HCL 20 MG/ML IJ SOLN: 10.0000 mg | INTRAMUSCULAR | Status: AC | PRN

## 2021-06-06 MED ORDER — HEPARIN SODIUM (PORCINE) 5000 UNIT/ML IJ SOLN: 4000.0000 [IU] | Freq: Once | INTRAMUSCULAR | Status: DC

## 2021-06-06 MED ORDER — SODIUM CHLORIDE 0.9% FLUSH
3.0000 mL | Freq: Two times a day (BID) | INTRAVENOUS | Status: DC
  Administered 2021-06-06 – 2021-06-07 (×3): 3 mL via INTRAVENOUS

## 2021-06-06 MED ORDER — COLCHICINE 0.6 MG PO TABS
0.6000 mg | ORAL_TABLET | Freq: Two times a day (BID) | ORAL | Status: DC
  Administered 2021-06-06 – 2021-06-07 (×3): 0.6 mg via ORAL
  Filled 2021-06-06 (×6): qty 1

## 2021-06-06 MED ORDER — VERAPAMIL HCL 2.5 MG/ML IV SOLN
INTRAVENOUS | Status: DC | PRN
  Administered 2021-06-06: 2.5 mg via INTRA_ARTERIAL

## 2021-06-06 MED ORDER — LIDOCAINE HCL 1 % IJ SOLN
INTRAMUSCULAR | Status: AC
  Filled 2021-06-06: qty 20

## 2021-06-06 MED ORDER — MORPHINE SULFATE (PF) 2 MG/ML IV SOLN: 2.0000 mg | INTRAVENOUS | Status: AC | PRN

## 2021-06-06 MED ORDER — IOHEXOL 300 MG/ML  SOLN
INTRAMUSCULAR | Status: DC | PRN
  Administered 2021-06-06: 52 mL

## 2021-06-06 MED ORDER — HEPARIN (PORCINE) IN NACL 1000-0.9 UT/500ML-% IV SOLN
INTRAVENOUS | Status: AC
  Filled 2021-06-06: qty 1000

## 2021-06-06 MED ORDER — VERAPAMIL HCL 2.5 MG/ML IV SOLN
INTRAVENOUS | Status: AC
Start: 2021-06-06 — End: ?
  Filled 2021-06-06: qty 2

## 2021-06-06 MED ORDER — SODIUM CHLORIDE 0.9 % IV SOLN: INTRAVENOUS | Status: AC

## 2021-06-06 SURGICAL SUPPLY — 13 items
CATH 5F 110X4 TIG (CATHETERS) ×1 IMPLANT
CATH INFINITI 5FR ANG PIGTAIL (CATHETERS) ×1 IMPLANT
CATH INFINITI JR4 5F (CATHETERS) ×1 IMPLANT
DEVICE RAD TR BAND REGULAR (VASCULAR PRODUCTS) ×1 IMPLANT
DRAPE BRACHIAL (DRAPES) ×1 IMPLANT
GLIDESHEATH SLEND SS 6F .021 (SHEATH) ×1 IMPLANT
GUIDEWIRE INQWIRE 1.5J.035X260 (WIRE) IMPLANT
INQWIRE 1.5J .035X260CM (WIRE) ×2
KIT ENCORE 26 ADVANTAGE (KITS) IMPLANT
PACK CARDIAC CATH (CUSTOM PROCEDURE TRAY) ×2 IMPLANT
PROTECTION STATION PRESSURIZED (MISCELLANEOUS) ×2
SET ATX SIMPLICITY (MISCELLANEOUS) ×1 IMPLANT
STATION PROTECTION PRESSURIZED (MISCELLANEOUS) IMPLANT

## 2021-06-06 NOTE — ED Triage Notes (Signed)
C/O Chest pain, left chest.  States pain woke patient up from sleep.  Had similar episode 2-3 weeks ago, did not seek medical evaluation.  States pain was similar, but not as severe.  States pain at that time was worse when bending down. ? ?18g LFA ?324 ASA given and 2 spray NTG given by EMS PTA.  Pain reduced from 6 to 5 per EMS report. ?

## 2021-06-06 NOTE — Consult Note (Signed)
?Cardiology Consultation:  ? ?Patient ID: Clarence Long. ?MRN: [id number]; DOB: 1965/03/07 ? ?Admit date: 06/06/2021 ?Date of Consult: 06/06/2021 ? ?PCP:  Patient, No Pcp Per (Inactive) ?  ?[facility] Providers ?Cardiologist:  [facility] - Last seen 2019   ? ? ?Patient Profile:  ? ?Clarence Long. is a 56 y.o. male with a hx of bicuspid aortic valve, paroxysmal atrial tachycardia, and tobacco abuse, who is being seen 06/06/2021 for the evaluation of chest pain and abnormal EKG at the request of [place]. ? ?History of Present Illness:  ? ?Mr. Rosamilia woke up this morning with left-sided chest pain that he describes as a throbbing sensation.  He notes associated shortness of breath.  Pain persisted as he was getting ready for work and became more severe while he was on his way to work.  He therefore called EMS.  The pain is 9/10 in intensity at its most severe and radiates to the left side of the neck.  He felt similar pain a few weeks ago, though it resolved spontaneously on its own.  He was evaluated at [facility], most recently 2019, for bicuspid aortic valve.  Aorta was reportedly normal at that time.  He denies ever having undergone cardiac catheterization. ? ?In the ED, the patient complains of 5/6 chest pain, as the discomfort improved with aspirin and nitroglycerin administered by EMS.  He no longer feels short of breath.  He has not had any palpitations, lightheadedness, or edema. ? ? ?Past Medical History:  ?Diagnosis Date  ? Atrial tachycardia, paroxysmal ([place])   ? 05/2013  ? Bicuspid aortic valve   ? 05/2013  ? Calcific aortic stenosis of bicuspid valve   ? 05/2013  ? Chest pain   ? 05/2013  ? Hepatitis C 2014  ? treated at that time  ? Nicotine abuse   ? ? ?Past Surgical History:  ?Procedure Laterality Date  ? INGUINAL HERNIA REPAIR Bilateral 04/18/2017  ? Procedure: OPEN BILATERAL INGUINAL HERNIA REPAIR;  Surgeon: Olean Ree, MD;  Location: [facility] ORS;  Service: General;  Laterality: Bilateral;  ? MULTIPLE  TOOTH EXTRACTIONS  2017  ? all upper teeth  ? RECONSTRUCTION OF NOSE  2009  ? nose refractured once healed the fracture  ?  ? ?Home medications: None ? ?Inpatient Medications: ?Scheduled Meds: ? heparin  4,000 Units Intravenous Once  ? ?Continuous Infusions: ? sodium chloride    ? ?PRN Meds: ? ? ?Allergies:    ?Allergies  ?Allergen Reactions  ? Codeine Itching  ? Pollen Extract Other (See Comments)  ?  Eyes and nose running  ? ? ?Social History:   ?Social History  ? ?Tobacco Use  ? Smoking status: Every Day  ?  Packs/day: 0.50  ?  Types: Cigarettes  ? Smokeless tobacco: Never  ?Vaping Use  ? Vaping Use: Never used  ?Substance Use Topics  ? Alcohol use: Yes  ?  Alcohol/week: 12.0 standard drinks  ?  Types: 12 Cans of beer per week  ?  Comment: weekend drinking only  ? Drug use: No  ? ? ?  ?Family History:   ?Family History  ?Problem Relation Age of Onset  ? Arthritis/Rheumatoid Mother   ? Gout Father   ? Heart disease Father   ? Stroke Paternal Grandmother   ?  ? ?ROS:  ?Please see the history of present illness. All other ROS reviewed and negative.    ? ?Physical Exam/Data:  ? ?Vitals:  ? 06/06/21 0832 06/06/21 6948 06/06/21  8786 06/06/21 0940  ?BP:    105/80  ?Pulse: 63 61    ?Resp: 17 17    ?Temp:    98.3 ?F (36.8 ?C)  ?TempSrc:    Oral  ?SpO2: 100% 98% 98%   ?Weight:      ?Height:      ? ?No intake or output data in the 24 hours ending 06/06/21 0941 ? ?  06/06/2021  ?  8:14 AM 05/06/2017  ?  3:19 PM 04/18/2017  ?  7:43 AM  ?Last 3 Weights  ?Weight (lbs) 147 lb 14.9 oz 148 lb 155 lb  ?Weight (kg) 67.1 kg 67.132 kg 70.308 kg  ?   ?Body mass index is 20.06 kg/m?.  ?General:  Well nourished, well developed, in no acute distress ?HEENT: normal ?Neck: no JVD ?Vascular: No carotid bruits; Distal pulses 2+ bilaterally ?Cardiac:  normal S1, S2; RRR; 2/6 systolic murmur.  No rubs or gallops. ?Lungs: Coarse breath sounds bilaterally without wheezes or crackles. ?Abd: soft, nontender, no hepatomegaly  ?Ext: no  edema ?Musculoskeletal:  No deformities, BUE and BLE strength normal and equal ?Skin: warm and dry  ?Neuro:  CNs 2-12 intact, no focal abnormalities noted ?Psych:  Normal affect  ? ?EKG:  The EKG was personally reviewed and demonstrates: Normal sinus rhythm with subtle inferior ST elevation, slightly more pronounced than prior tracings as recently as 03/2017 ?Telemetry:  Telemetry was personally reviewed and demonstrates: Sinus rhythm with PVCs ? ?Relevant CV Studies: ?Limited bedside echocardiogram shows questionable inferolateral hypokinesis but otherwise preserved LVEF.  Aortic valve appears thickened but is not well visualized. ? ?Cardiac catheterization demonstrates a left dominant coronary system with small distal branches but no focal stenosis/occlusion in the large epicardial coronary arteries.  LVEF normal by left ventriculogram. ? ?Laboratory Data: ? ?High Sensitivity Troponin:   ?Recent Labs  ?Lab 06/06/21 ?7672  ?TROPONINIHS 4  ?   ?Chemistry ?Recent Labs  ?Lab 06/06/21 ?0947  ?NA 137  ?K 4.3  ?CL 106  ?CO2 25  ?GLUCOSE 97  ?BUN 15  ?CREATININE 1.05  ?CALCIUM 9.1  ?GFRNONAA >60  ?ANIONGAP 6  ?  ?Recent Labs  ?Lab 06/06/21 ?0962  ?PROT 7.4  ?ALBUMIN 4.0  ?AST 18  ?ALT 30  ?ALKPHOS 69  ?BILITOT 0.4  ? ?Lipids  ?Recent Labs  ?Lab 06/06/21 ?8366  ?CHOL 196  ?TRIG 86  ?HDL 55  ?[place] 124*  ?CHOLHDL 3.6  ?  ?Hematology ?Recent Labs  ?Lab 06/06/21 ?2947  ?WBC 7.8  ?RBC 4.45  ?HGB 14.3  ?HCT 42.8  ?MCV 96.2  ?MCH 32.1  ?MCHC 33.4  ?RDW 12.6  ?PLT 227  ? ?Thyroid No results for input(s): TSH, FREET4 in the last 168 hours.  ?BNPNo results for input(s): BNP, PROBNP in the last 168 hours.  ?DDimer No results for input(s): DDIMER in the last 168 hours. ? ? ?Radiology/Studies:  ?No results found. ? ? ?Assessment and Plan:  ? ?Chest pain: ?Initial concern was for inferior STEMI, though patient has had some early repolarization on EKGs in the past.  It was slightly more pronounced on tracing today.  Given ongoing chest  pain, decision was made to proceed with emergent cardiac catheterization and possible PCI.  Cath did not show any obstructive CAD, though the small distal branches appear somewhat tapered.  Question vasospasm.  Spontaneous coronary artery dissection seems less likely.  Other considerations would include pericarditis and aortic dissection, though visualized portion of the aorta on the left ventriculogram does not show  an obvious dissection or significant dilation. ?-Repeat high-sensitivity troponin after 2 hours. ?-Obtain CTA chest, abdomen, and pelvis to exclude acute aortic syndrome. ?-Obtain echocardiogram. ?-Pain control with as needed morphine and acetaminophen. ?-If CTA chest unremarkable, consider empiric treatment for pericarditis with colchicine. ?-Check ESR and CRP. ?-Check urine drug screen. ? ?Bicuspid aortic valve: ?Thickened valve noted on bedside echo today.  Left heart catheterization showed only mildly elevated aortic valve gradient (approximately 10 mmHg). ?-Follow-up echocardiogram. ? ?For questions or updates, please contact [place] ?Please consult [email or web address] for contact info under [facility]. ? ?Signed, ?Nelva Bush, MD  ?06/06/2021 9:41 AM ? ?

## 2021-06-06 NOTE — ED Provider Notes (Addendum)
? ?[facility], [place] ?Provider Note ? ? ? Event Date/Time  ? First MD Initiated Contact with Patient 06/06/21 [phone]   ?  (approximate) ? ? ?History  ? ?Chest Pain ? ? ?HPI ? ?Clarence Long. is a 56 y.o. male with past medical history of tobacco use here with chest pain.  The patient states that he woke up this morning several hours ago with acute, sharp but also pressure-like substernal chest pain.  It radiates towards the shoulder.  He has had associated shortness of breath.  He had some somewhat similar symptoms approximately 1 to 2 weeks ago that resolved spontaneously.  He states they came back today and were more severe so he presents for evaluation.  Denies known history of coronary disease.  He does not regularly see a physician.  He was given nitroglycerin with EMS with some mild improvement.  He was given aspirin as well.  The pain is not sharp or tearing.  No numbness, weakness, or neurological symptoms. ?  ? ? ?Physical Exam  ? ?Triage Vital Signs: ?ED Triage Vitals  ?Enc Vitals Group  ?   BP 06/06/21 0815 119/81  ?   Pulse Rate 06/06/21 0814 68  ?   Resp 06/06/21 0814 16  ?   Temp 06/06/21 0814 97.9 ?F (36.6 ?C)  ?   Temp Source 06/06/21 0814 Oral  ?   SpO2 06/06/21 0814 99 %  ?   Weight 06/06/21 0814 147 lb 14.9 oz (67.1 kg)  ?   Height 06/06/21 0814 6' (1.829 m)  ?   Head Circumference --   ?   Peak Flow --   ?   Pain Score 06/06/21 0813 5  ?   Pain Loc --   ?   Pain Edu? --   ?   Excl. in [place]? --   ? ? ?Most recent vital signs: ?Vitals:  ? 06/06/21 0833 06/06/21 0847  ?BP:    ?Pulse: 61   ?Resp: 17   ?Temp:    ?SpO2: 98% 98%  ? ? ? ?General: Awake, no distress.  Appears slightly uncomfortable ?CV:  Good peripheral perfusion.  Regular rate.  Pulses 2+ and symmetric. ?Resp:  Normal effort.  No rales. ?Abd:  No distention.  No tenderness. ?Other:  Pulses 2+ and symmetric bilateral upper and lower extremities. ? ? ?ED Results / Procedures / Treatments  ? ?Labs ?(all labs ordered are  listed, but only abnormal results are displayed) ?Labs Reviewed  ?CBC WITH DIFFERENTIAL/PLATELET - Abnormal; Notable for the following components:  ?    Result Value  ? Basophils Absolute 0.2 (*)   ? All other components within normal limits  ?RESP PANEL BY RT-PCR (FLU A&B, COVID) ARPGX2  ?PROTIME-INR  ?APTT  ?HEMOGLOBIN A1C  ?COMPREHENSIVE METABOLIC PANEL  ?LIPID PANEL  ?TROPONIN I (HIGH SENSITIVITY)  ? ? ? ?EKG ?EKG: Normal sinus rhythm, ventricular rate 68.  PR 165, QRS 80, QTc 414.  ST elevated in inferior leads II, 3, aVF.  T wave inversion in aVL.  However, this does appear somewhat similar morphology with some repolarization compared to prior. ? ? ?RADIOLOGY ?Chest x-ray: Pending ? ? ?I also independently reviewed and agree wit radiologist interpretations. ? ? ?PROCEDURES: ? ?Critical Care performed: Yes, see critical care procedure note(s) ? ?.Critical Care ?Performed by: Duffy Bruce, MD ?Authorized by: Duffy Bruce, MD  ? ?Critical care provider statement:  ?  Critical care time (minutes):  30 ?  Critical care time  was exclusive of:  Separately billable procedures and treating other patients ?  Critical care was necessary to treat or prevent imminent or life-threatening deterioration of the following conditions:  Circulatory failure, cardiac failure and respiratory failure ?  Critical care was time spent personally by me on the following activities:  Development of treatment plan with patient or surrogate, discussions with consultants, evaluation of patient's response to treatment, examination of patient, ordering and review of laboratory studies, ordering and review of radiographic studies, ordering and performing treatments and interventions, pulse oximetry, re-evaluation of patient's condition and review of old charts ? ? ? ?MEDICATIONS ORDERED IN ED: ?Medications  ?heparin injection 4,000 Units (4,000 Units Intravenous Not Given 06/06/21 0826)  ?0.9 %  sodium chloride infusion (has no  administration in time range)  ?fentaNYL (SUBLIMAZE) injection 50 mcg (50 mcg Intravenous Given 06/06/21 0829)  ?ondansetron [facility]) injection 4 mg (4 mg Intravenous Given 06/06/21 0829)  ? ? ? ?IMPRESSION / MDM / ASSESSMENT AND PLAN / ED COURSE  ?I reviewed the triage vital signs and the nursing notes. ?             ?               ? ? ?The patient is on the cardiac monitor to evaluate for evidence of arrhythmia and/or significant heart rate changes. ? ? ?Ddx:  ?STEMI, NSTEMI, unstable angina, musculoskeletal chest pain, GERD, do not suspect dissection or PE clinically ? ? ?MDM:  ?56 year old male here with chest pain concerning for angina.  Patient has some baseline repolarization on his EKG but fairly dynamic ST changes with what appears to be ST elevations in 2, 3, aVF on initial EKG here, increased from initial EKG with EMS.  Code STEMI subsequently activated.  Patient has been given aspirin and heparin.  His pain is improving with nitroglycerin and fentanyl.  Patient has no tearing component, pulses are symmetric, he is not severely hypertensive, and I do not suspect dissection or PE clinically based on his vitals or history.  Dr. Saunders Revel of cardiology consulted, at bedside, and will take for PCI.  I was able to reach sister, Gerald Stabs, via telephone at [phone] who is aware pt will be taken to cath lab. Labs sent and are pending at time of going to Cath Lab. ? ?MEDICATIONS GIVEN IN ED: ?Medications  ?heparin injection 4,000 Units (4,000 Units Intravenous Not Given 06/06/21 0826)  ?0.9 %  sodium chloride infusion (has no administration in time range)  ?fentaNYL (SUBLIMAZE) injection 50 mcg (50 mcg Intravenous Given 06/06/21 0829)  ?ondansetron [facility]) injection 4 mg (4 mg Intravenous Given 06/06/21 0829)  ? ? ? ?Consults:  ?Cardiology, Dr. Saunders Revel, who is present at bedside ? ? ?EMR reviewed  ?Cardiology note from 09/2019 for bicuspid aortic valve noted, no mention of coronary disease at that time, with Dr.  Kennith Center ? ? ? ? ?FINAL CLINICAL IMPRESSION(S) / ED DIAGNOSES  ? ?Final diagnoses:  ?ST elevation myocardial infarction (STEMI), unspecified artery ([place])  ? ? ? ?Rx / DC Orders  ? ?ED Discharge Orders   ? ? None  ? ?  ? ? ? ?Note:  This document was prepared using Dragon voice recognition software and may include unintentional dictation errors. ?  ?Duffy Bruce, MD ?06/06/21 [phone] ? ?  ?Duffy Bruce, MD ?06/06/21 0848 ?- ?  ?Duffy Bruce, MD ?06/06/21 [phone] ? ?

## 2021-06-06 NOTE — Hospital Course (Signed)
Clarence Bara. is a 56 y.o. male with medical history significant of bicuspid aortic valve, paroxysmal atrial tachycardia, tobacco and cocaine abuse who presented with chest pain with activation of code STEMI.   ? ?Urgent heart cath found no obstruction.  Pt was started treatment for presumed pericarditis. ?

## 2021-06-06 NOTE — ED Notes (Signed)
Code  stemi  called  to  [facility] 

## 2021-06-06 NOTE — H&P (Addendum)
?History and Physical  ? ? ?Clarence Penna Swaney Jr. VFI:[id number] DOB: 1965/04/13 DOA: 06/06/2021 ? ?PCP: Patient, No Pcp Per (Inactive)  ?Patient coming from: home ? ?I have personally briefly reviewed patient's old medical records in [place] ? ?Chief Complaint: chest pain ? ?HPI: Clarence Long. is a 56 y.o. male with medical history significant of bicuspid aortic valve, paroxysmal atrial tachycardia, tobacco and cocaine abuse who presented with chest pain with activation of code STEMI. ? ?Pt reported waking up from sleep with chest pain that's left-sided, worse with deep breath, and associated with dyspnea.  Pt had similar chest pain a couple of weeks ago that self resolved.  No recent illness.  Aside from chest pain, pt was at his baseline.   ? ?ED Course: initial vitals wnl.  Chest pain mildly improved with nitroglycerin given by EMS.  Initial EKG showed subtle inferior ST elevation. Limited bedside echocardiogram showed questionable inferolateral hypokinesis.  Labs unremarkable, and initial trop neg.  Code STEMI was called and pt was taken emergently for heart cath which found no significant obstruction.  Pt was admitted as observation. ? ?Assessment/Plan ?Principal Problem: ?  Chest pain of uncertain etiology ? ?# Chest pain, ACS ruled out ?# Possible Pericarditis ?--trop neg.  Cardiac cath found no obstruction.  CTA dissection study neg.  Pericarditis was on the Ddx, and colchicine was started, however CRP wnl.  Cocaine came back pos, so pt may have cocaine-induced vasospasm. ?Plan: ?--cont colchicine for now ?--Echo pending ?--SL nitro PRN for chest pain ?--consider Calcium channel blockers, amlodipine (due to low HR), if chest pain doesn't improve with colchicine ?--keep on tele ? ?# Cocaine abuse ?--UDS pos for cocaine.  Had prior history. ? ?# Bicuspid aortic valve ?--Echo pending ?--f/u outpatient cardio ? ? ?DVT prophylaxis: Lovenox SQ ?Code Status: Full code  ?Family Communication:    ?Disposition Plan: home  ?Consults called: cardiology ?Level of care: Stepdown ? ? ?Review of Systems: As per HPI otherwise complete review of systems negative.  ? ?Past Medical History:  ?Diagnosis Date  ? Atrial tachycardia, paroxysmal ([place])   ? 05/2013  ? Bicuspid aortic valve   ? 05/2013  ? Calcific aortic stenosis of bicuspid valve   ? 05/2013  ? Chest pain   ? 05/2013  ? Hepatitis C 2014  ? treated at that time  ? Nicotine abuse   ? ? ?Past Surgical History:  ?Procedure Laterality Date  ? INGUINAL HERNIA REPAIR Bilateral 04/18/2017  ? Procedure: OPEN BILATERAL INGUINAL HERNIA REPAIR;  Surgeon: Olean Ree, MD;  Location: [facility] ORS;  Service: General;  Laterality: Bilateral;  ? MULTIPLE TOOTH EXTRACTIONS  2017  ? all upper teeth  ? RECONSTRUCTION OF NOSE  2009  ? nose refractured once healed the fracture  ? ? ? reports that he has been smoking cigarettes. He has been smoking an average of .5 packs per day. He has never used smokeless tobacco. He reports current alcohol use of about 12.0 standard drinks per week. He reports that he does not use drugs. ? ?Allergies  ?Allergen Reactions  ? Codeine Itching  ? Pollen Extract Other (See Comments)  ?  Eyes and nose running  ? ? ?Family History  ?Problem Relation Age of Onset  ? Arthritis/Rheumatoid Mother   ? Gout Father   ? Heart disease Father   ? Stroke Paternal Grandmother   ? ? ? ?Prior to Admission medications   ?Medication Sig Start Date End Date Taking? Authorizing Provider  ?  ibuprofen (ADVIL,MOTRIN) 600 MG tablet Take 1 tablet (600 mg total) by mouth every 8 (eight) hours as needed for fever, headache, mild pain or moderate pain. 04/18/17   Olean Ree, MD  ? ? ?Physical Exam: ?Vitals:  ? 06/06/21 1607 06/06/21 3710 06/06/21 0847 06/06/21 0940  ?BP:    105/80  ?Pulse: 63 61    ?Resp: 17 17    ?Temp:    98.3 ?F (36.8 ?C)  ?TempSrc:    Oral  ?SpO2: 100% 98% 98%   ?Weight:      ?Height:      ? ? ?Constitutional: NAD, AAOx3 ?HEENT: conjunctivae and lids normal,  EOMI ?CV: No cyanosis.   ?RESP: normal respiratory effort, on RA ?Extremities: No effusions, edema in BLE ?SKIN: warm, dry ?Neuro: II - XII grossly intact.   ?Psych: Normal mood and affect.  Appropriate judgement and reason ? ? ?Labs on Admission: I have personally reviewed following labs and imaging studies ? ?CBC: ?Recent Labs  ?Lab 06/06/21 ?6269  ?WBC 7.8  ?NEUTROABS 4.7  ?HGB 14.3  ?HCT 42.8  ?MCV 96.2  ?PLT 227  ? ?Basic Metabolic Panel: ?Recent Labs  ?Lab 06/06/21 ?4854  ?NA 137  ?K 4.3  ?CL 106  ?CO2 25  ?GLUCOSE 97  ?BUN 15  ?CREATININE 1.05  ?CALCIUM 9.1  ? ?GFR: ?Estimated Creatinine Clearance: 76.3 mL/min (by C-G formula based on SCr of 1.05 mg/dL). ?Liver Function Tests: ?Recent Labs  ?Lab 06/06/21 ?6270  ?AST 18  ?ALT 30  ?ALKPHOS 69  ?BILITOT 0.4  ?PROT 7.4  ?ALBUMIN 4.0  ? ?No results for input(s): LIPASE, AMYLASE in the last 168 hours. ?No results for input(s): AMMONIA in the last 168 hours. ?Coagulation Profile: ?Recent Labs  ?Lab 06/06/21 ?3500  ?INR 1.0  ? ?Cardiac Enzymes: ?No results for input(s): CKTOTAL, CKMB, CKMBINDEX, TROPONINI in the last 168 hours. ?BNP (last 3 results) ?No results for input(s): PROBNP in the last 8760 hours. ?HbA1C: ?No results for input(s): HGBA1C in the last 72 hours. ?CBG: ?Recent Labs  ?Lab 06/06/21 ?0941  ?GLUCAP 106*  ? ?Lipid Profile: ?Recent Labs  ?  06/06/21 ?9381  ?CHOL 196  ?HDL 55  ?[place] 124*  ?TRIG 86  ?CHOLHDL 3.6  ? ?Thyroid Function Tests: ?No results for input(s): TSH, T4TOTAL, FREET4, T3FREE, THYROIDAB in the last 72 hours. ?Anemia Panel: ?No results for input(s): VITAMINB12, FOLATE, FERRITIN, TIBC, IRON, RETICCTPCT in the last 72 hours. ?Urine analysis: ?   ?Component Value Date/Time  ? COLORURINE Yellow 10/26/2011 1622  ? APPEARANCEUR Clear 10/26/2011 1622  ? LABSPEC 1.018 10/26/2011 1622  ? PHURINE 6.0 10/26/2011 1622  ? GLUCOSEU Negative 10/26/2011 1622  ? HGBUR Negative 10/26/2011 1622  ? BILIRUBINUR Negative 10/26/2011 1622  ? KETONESUR  Negative 10/26/2011 1622  ? PROTEINUR Negative 10/26/2011 1622  ? NITRITE Negative 10/26/2011 1622  ? LEUKOCYTESUR Negative 10/26/2011 1622  ? ? ?Radiological Exams on Admission: ?No results found. ? ? ? ?Enzo Bi MD ?[facility] ? ?If 7PM-7AM, please contact night-coverage ?06/06/2021, 9:49 AM  ? ? ?

## 2021-06-06 NOTE — Assessment & Plan Note (Signed)
--  Echo pending ?--f/u outpatient cardio ?

## 2021-06-06 NOTE — Assessment & Plan Note (Signed)
--  trop neg.  Cardiac cath found no obstruction.  CTA dissection study neg.  Pericarditis was on the Ddx, and colchicine was started, however CRP wnl.  Cocaine came back pos, so pt may have cocaine-induced vasospasm. ?Plan: ?--cont colchicine for now ?--Echo pending ?--SL nitro PRN for chest pain ?--consider Calcium channel blockers, amlodipine (due to low HR), if chest pain doesn't improve with colchicine ?--keep on tele ?

## 2021-06-06 NOTE — Assessment & Plan Note (Signed)
--  UDS pos for cocaine.  Had prior history. ?

## 2021-06-07 ENCOUNTER — Encounter: Payer: Self-pay | Admitting: Internal Medicine

## 2021-06-07 DIAGNOSIS — F1410 Cocaine abuse, uncomplicated: Secondary | ICD-10-CM

## 2021-06-07 MED ORDER — COLCHICINE 0.6 MG PO TABS: 0.6000 mg | ORAL_TABLET | Freq: Two times a day (BID) | ORAL | 0 refills | Status: AC

## 2021-06-07 MED ORDER — ISOSORBIDE MONONITRATE ER 30 MG PO TB24
15.0000 mg | ORAL_TABLET | Freq: Every day | ORAL | Status: DC
  Administered 2021-06-07: 15 mg via ORAL
  Filled 2021-06-07: qty 1

## 2021-06-07 NOTE — Progress Notes (Signed)
Pt discharged; given instructions and "work excuse" letter under letters tab in [place]. Pt verbalized understanding of all instructions given and that cardiology office will call him with an appointment to follow up per Dr. Garen Lah.  ?

## 2021-06-07 NOTE — Progress Notes (Addendum)
? ? ?Progress Note ? ?Patient Name: Clarence Long. ?Date of Encounter: 06/07/2021 ? ?Primary Cardiologist: New to [facility] - consult by End ? ?Subjective  ? ?No chest pain, dyspnea, palpitations, dizziness, presyncope, or syncope. No right radial arteriotomy site complications. BP stable.  ? ?Inpatient Medications  ?  ?Scheduled Meds: ? aspirin  81 mg Oral Daily  ? colchicine  0.6 mg Oral BID  ? enoxaparin (LOVENOX) injection  40 mg Subcutaneous Q24H  ? sodium chloride flush  3 mL Intravenous Q12H  ? ?Continuous Infusions: ? sodium chloride    ? ?PRN Meds: ?sodium chloride, acetaminophen, morphine injection, ondansetron (ZOFRAN) IV, sodium chloride flush  ? ?Vital Signs  ?  ?Vitals:  ? 06/07/21 0300 06/07/21 0400 06/07/21 0500 06/07/21 0800  ?BP:  105/69  106/71  ?Pulse: (!) 57 (!) 51 65 63  ?Resp: '14 14 13 15  '$ ?Temp:  98.2 ?F (36.8 ?C)    ?TempSrc:  Oral    ?SpO2: 96% 98% 97% 97%  ?Weight:      ?Height:      ? ? ?Intake/Output Summary (Last 24 hours) at 06/07/2021 0835 ?Last data filed at 06/07/2021 0500 ?Gross per 24 hour  ?Intake 360 ml  ?Output 2375 ml  ?Net -2015 ml  ? ?Filed Weights  ? 06/06/21 0814 06/06/21 0940  ?Weight: 67.1 kg 72.2 kg  ? ? ?Telemetry  ?  ?SR with rates in the 50s to 70s bpm - Personally Reviewed ? ?ECG  ?  ?No new tracings - Personally Reviewed ? ?Physical Exam  ? ?GEN: No acute distress.   ?Neck: No JVD. ?Cardiac: RRR, no murmurs, rubs, or gallops. Right radial arteriotomy site without active bleeding, bruising, swelling, warmth, erythema, or TTP. Radial pulse 2+ proximal and distal to the arteriotomy site.  ?Respiratory: Clear to auscultation bilaterally.  ?GI: Soft, nontender, non-distended.   ?MS: No edema; No deformity. ?Neuro:  Alert and oriented x 3; Nonfocal.  ?Psych: Normal affect. ? ?Labs  ?  ?Chemistry ?Recent Labs  ?Lab 06/06/21 ?4656  ?NA 137  ?K 4.3  ?CL 106  ?CO2 25  ?GLUCOSE 97  ?BUN 15  ?CREATININE 1.05  ?CALCIUM 9.1  ?PROT 7.4  ?ALBUMIN 4.0  ?AST 18  ?ALT 30  ?ALKPHOS 69   ?BILITOT 0.4  ?GFRNONAA >60  ?ANIONGAP 6  ?  ? ?Hematology ?Recent Labs  ?Lab 06/06/21 ?8127  ?WBC 7.8  ?RBC 4.45  ?HGB 14.3  ?HCT 42.8  ?MCV 96.2  ?MCH 32.1  ?MCHC 33.4  ?RDW 12.6  ?PLT 227  ? ? ?Cardiac EnzymesNo results for input(s): TROPONINI in the last 168 hours. No results for input(s): TROPIPOC in the last 168 hours.  ? ?BNPNo results for input(s): BNP, PROBNP in the last 168 hours.  ? ?DDimer No results for input(s): DDIMER in the last 168 hours.  ? ?Radiology  ?  ?CT Angio Chest/Abd/Pel for Dissection W and/or W/WO ? ?Result Date: 06/06/2021 ?IMPRESSION: 1. No evidence of aortic dissection or other acute abnormality in the chest, abdomen, or pelvis. 2. Bladder diverticulum. 3. Aortic Atherosclerosis (ICD10-I70.0). Electronically Signed   By: Logan Bores M.D.   On: 06/06/2021 11:13   ? ?Cardiac Studies  ? ?LHC 06/06/2021: ?Conclusions: ?No significant coronary artery disease noted involving the major epicardial coronary arteries.  There is some tapering and irregularity of the distal LAD and PDA.  Question vasospasm or less likely spontaneous coronary artery dissection. ?Normal left ventricular systolic function and filling pressure. ?  ?Recommendations: ?Admit  for further work-up of chest pain. ?Obtain CTA chest to exclude dissection as well as echocardiogram to reevaluate aortic valve. ?Consider empiric treatment for pericarditis. ?Primary prevention of coronary artery disease. ?__________ ? ?2D echo 06/06/2021: ?1. Left ventricular ejection fraction, by estimation, is 50 to 55%. The  ?left ventricle has low normal function. The left ventricle has no regional  ?wall motion abnormalities. Left ventricular diastolic parameters were  ?normal.  ? 2. Right ventricular systolic function is normal. The right ventricular  ?size is normal. There is normal pulmonary artery systolic pressure.  ? 3. The mitral valve is normal in structure. Trivial mitral valve  ?regurgitation.  ? 4. The aortic valve is bicuspid. There  is severe calcifcation of the  ?aortic valve. There is severe thickening of the aortic valve. Aortic valve  ?regurgitation is not visualized. Aortic valve sclerosis/calcification is  ?present, without any evidence of  ?aortic stenosis.  ? 5. Aortic dilatation noted. There is mild dilatation of the aortic root,  ?measuring 43 mm.  ? 6. The inferior vena cava is dilated in size with <50% respiratory  ?variability, suggesting right atrial pressure of 15 mmHg. ? ?Patient Profile  ?   ?56 y.o. male with history of bicuspid aortic valve, paroxysmal atrial tachycardia, and tobacco abuse, who is being seen today for the evaluation of chest pain and abnormal EKG. ? ?Assessment & Plan  ?  ?1. Chest pain: ?-No further chest pain ?-LHC this admission without evidence of significant CAD with possible vasospasm noted (UDS positive for cocaine) in the distal LAD and PDA ?-CTA chest/abdomen/pelvis without acute aortic pathology  ?-Continue empiric treatment with colchicine for possible pericarditis  ?-Aggressive risk factor modification ?-Post cath instructions  ? ?2. 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 ?3. Polysubstance use: ?-UDS positive for cocaine, complete cessation recommended ? ? ?-Ok for discharge from our perspective once staffed by MD with follow up in our office in 2 weeks (we will arrange and have the office call the patient). He will need an out of work note for the next 5 days.  ? ?   ? ?For questions or updates, please contact [place] ?Please consult [email or web address] for contact info under Cardiology/STEMI. ?  ? ?Signed, ?Christell Faith, PA-C ?[facility] ?Pager: [phone] ?06/07/2021, 8:35 AM ? ?

## 2021-06-07 NOTE — Discharge Summary (Signed)
Physician Discharge Summary  ?Clarence Penna Fallas Jr. XIP:[id number] DOB: Apr 18, 1965 DOA: 06/06/2021 ? ?PCP: Patient, No Pcp Per (Inactive) ? ?Admit date: 06/06/2021 ?Discharge date: 06/07/2021 ? ?Admitted From: Home ?Disposition:  Home ? ?Recommendations for Outpatient Follow-up:  ?Follow up with PCP in 1-2 weeks ?Follow up with cardiology ? ?Home Health:No ?Equipment/Devices:No ? ?Discharge Condition:Stable  ?CODE STATUS:FULL  ?Diet recommendation: Regular ? ?Brief/Interim Summary: ?56 y.o. male with medical history significant of bicuspid aortic valve, paroxysmal atrial tachycardia, tobacco and cocaine abuse who presented with chest pain with activation of code STEMI. ?  ?Pt reported waking up from sleep with chest pain that's left-sided, worse with deep breath, and associated with dyspnea.  Pt had similar chest pain a couple of weeks ago that self resolved.  No recent illness.  Aside from chest pain, pt was at his baseline.   ?  ?ED Course: initial vitals wnl.  Chest pain mildly improved with nitroglycerin given by EMS.  Initial EKG showed subtle inferior ST elevation. Limited bedside echocardiogram showed questionable inferolateral hypokinesis.  Labs unremarkable, and initial trop neg.  Code STEMI was called and pt was taken emergently for heart cath which found no significant obstruction.  Pt was admitted as observation. ? ?Clean catheterization.  Suspect coronary vasospasm secondary to cocaine use.  Patient educated.  At time of discharge patient is stable.  Imdur was attempted however blood pressure unsafe.  This medication was discontinued.  Will recommend colchicine 0.6 mg p.o. twice daily for prophylactic treatment for pericarditis at time of discharge.  Patient will follow-up outpatient with cardiology. ? ? ? ?Discharge Diagnoses:  ?Principal Problem: ?  Chest pain of uncertain etiology ?Active Problems: ?  Bicuspid aortic valve ?  Cocaine abuse ([place]) ? ?# Chest pain, ACS ruled out ?# Possible  Pericarditis ?--trop neg.  Cardiac cath found no obstruction.  CTA dissection study neg.  Pericarditis was on the Ddx, and colchicine was started, however CRP wnl.  Cocaine came back pos, so pt may have cocaine-induced vasospasm. ?Plan: ?Discharge home.  Recommend colchicine 0.6 mg p.o. twice daily.  Follow-up outpatient cardiology.  Cocaine cessation strongly advised. ?  ?# Cocaine abuse ?--UDS pos for cocaine.  Had prior history.  Cessation strongly advised ?  ?# Bicuspid aortic valve ?Outpatient serial echocardiograms ?Discussed with cardiology ?  ? ?Discharge Instructions ? ?Discharge Instructions   ? ? Diet - low sodium heart healthy   Complete by: As directed ?  ? Increase activity slowly   Complete by: As directed ?  ? ?  ? ?Allergies as of 06/07/2021   ? ?   Reactions  ? Codeine Itching  ? Pollen Extract Other (See Comments)  ? Eyes and nose running  ? ?  ? ?  ?Medication List  ?  ? ?TAKE these medications   ? ?colchicine 0.6 MG tablet ?Take 1 tablet (0.6 mg total) by mouth 2 (two) times daily for 14 days. ?Notes to patient: Next dose due Thursday, 4/20 ?  ?ibuprofen 600 MG tablet ?Commonly known as: ADVIL ?Take 1 tablet (600 mg total) by mouth every 8 (eight) hours as needed for fever, headache, mild pain or moderate pain. ?  ? ?  ? ? ?Allergies  ?Allergen Reactions  ? Codeine Itching  ? Pollen Extract Other (See Comments)  ?  Eyes and nose running  ? ? ?Consultations: ?Cardiology-[facility] ? ? ?Procedures/Studies: ?CARDIAC CATHETERIZATION ? ?Result Date: 06/07/2021 ?Conclusions: No significant coronary artery disease noted involving the major epicardial coronary arteries.  There  is some tapering and irregularity of the distal LAD and PDA.  Question vasospasm or less likely spontaneous coronary artery dissection. Normal left ventricular systolic function and filling pressure. Recommendations: Admit for further work-up of chest pain. Obtain CTA chest to exclude dissection as well as echocardiogram to reevaluate  aortic valve. Consider empiric treatment for pericarditis. Primary prevention of coronary artery disease. Nelva Bush, MD [facility] ? ?ECHOCARDIOGRAM COMPLETE ? ?Result Date: 06/06/2021 ?   ECHOCARDIOGRAM REPORT   Patient Name:   Clarence Long. Date of Exam: 06/06/2021 Medical Rec #:  [id number]             Height:       72.0 in Accession #:    [id number]            Weight:       159.2 lb Date of Birth:  12/31/1965            BSA:          1.934 m? Patient Age:    73 years              BP:           106/78 mmHg Patient Gender: M                     HR:           65 bpm. Exam Location:  [facility] Procedure: 2D Echo, Cardiac Doppler and Color Doppler Indications:     R07.9 Chest pain  History:         Patient has no prior history of Echocardiogram examinations.                  Catheterization today, Aortic Valve Disease and Bicuspid aortic                  valve, Arrythmias:atrial tachycardia, paroxysmal; Risk                  Factors:Current Smoker.  Sonographer:     Rosalia Hammers Referring Phys:  [id number] [place] Diagnosing Phys: Nelva Bush MD  Sonographer Comments: Suboptimal parasternal window. IMPRESSIONS  1. Left ventricular ejection fraction, by estimation, is 50 to 55%. The left ventricle has low normal function. The left ventricle has no regional wall motion abnormalities. Left ventricular diastolic parameters were normal.  2. Right ventricular systolic function is normal. The right ventricular size is normal. There is normal pulmonary artery systolic pressure.  3. The mitral valve is normal in structure. Trivial mitral valve regurgitation.  4. The aortic valve is bicuspid. There is severe calcifcation of the aortic valve. There is severe thickening of the aortic valve. Aortic valve regurgitation is not visualized. Aortic valve sclerosis/calcification is present, without any evidence of aortic stenosis.  5. Aortic dilatation noted. 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   6. The inferior vena cava is dilated in size with <50% respiratory variability, suggesting right atrial pressure of 15 mmHg. FINDINGS  Left Ventricle: Left ventricular ejection fraction, by estimation, is 50 to 55%. The left ventricle has low normal function. The left ventricle has no regional wall motion abnormalities. The left ventricular internal cavity size was normal in size. There is borderline left ventricular hypertrophy. Left ventricular diastolic parameters were normal. Right Ventricle: The right ventricular size is normal. No increase in right ventricular wall thickness. Right ventricular systolic function is normal. There is normal pulmonary artery systolic pressure. The  tricuspid regurgitant velocity is 2.18 m/s, and  with an assumed right atrial pressure of 15 mmHg, the estimated right ventricular systolic pressure is 37.8 mmHg. Left Atrium: Left atrial size was normal in size. Right Atrium: Right atrial size was normal in size. Pericardium: Trivial pericardial effusion is present. Mitral Valve: The mitral valve is normal in structure. Trivial mitral valve regurgitation. MV peak gradient, 1.6 mmHg. The mean mitral valve gradient is 1.0 mmHg. Tricuspid Valve: The tricuspid valve is normal in structure. Tricuspid valve regurgitation is trivial. Aortic Valve: The aortic valve is bicuspid. There is severe calcifcation of the aortic valve. There is severe thickening of the aortic valve. Aortic valve regurgitation is not visualized. Aortic valve sclerosis/calcification is present, without any evidence of aortic stenosis. Aortic valve mean gradient measures 1.0 mmHg. Aortic valve peak gradient measures 2.1 mmHg. Aortic valve area, by VTI measures 5.69 cm?. Pulmonic Valve: The pulmonic valve was not well visualized. Pulmonic valve regurgitation is not visualized. No evidence of pulmonic stenosis. Aorta: Aortic dilatation noted. 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 Pulmonary Artery: The  pulmonary artery is not well seen. Venous: The inferior vena cava is dilated in size with less than 50% respiratory variability, suggesting right atrial pressure of 15 mmHg. IAS/Shunts: The interatrial septum was not we

## 2021-06-08 ENCOUNTER — Telehealth: Payer: Self-pay | Admitting: Internal Medicine

## 2021-06-08 NOTE — Telephone Encounter (Signed)
-----   Message from Eli Phillips sent at 06/08/2021  9:16 AM EDT ----- ? ?----- Message ----- ?From: Rise Mu, PA-C ?Sent: 06/08/2021   7:15 AM EDT ?To: [facility] Scheduling ? ?Please schedule patient for hospital follow in 2 weeks. Thanks! ? ? ?

## 2021-06-08 NOTE — Telephone Encounter (Signed)
LMOV to schedule  

## 2021-07-20 ENCOUNTER — Telehealth: Payer: Self-pay | Admitting: Emergency Medicine

## 2021-07-20 ENCOUNTER — Other Ambulatory Visit: Payer: Self-pay

## 2021-07-20 ENCOUNTER — Emergency Department
Admission: EM | Admit: 2021-07-20 | Discharge: 2021-07-20 | Disposition: A | Discharge status: Home / Self Care | Payer: 59 | Attending: Emergency Medicine | Admitting: Emergency Medicine

## 2021-07-20 DIAGNOSIS — W57XXXA Bitten or stung by nonvenomous insect and other nonvenomous arthropods, initial encounter: Secondary | ICD-10-CM | POA: Diagnosis not present

## 2021-07-20 DIAGNOSIS — L089 Local infection of the skin and subcutaneous tissue, unspecified: Secondary | ICD-10-CM | POA: Diagnosis not present

## 2021-07-20 DIAGNOSIS — L03116 Cellulitis of left lower limb: Secondary | ICD-10-CM | POA: Diagnosis not present

## 2021-07-20 DIAGNOSIS — S80862A Insect bite (nonvenomous), left lower leg, initial encounter: Secondary | ICD-10-CM | POA: Diagnosis not present

## 2021-07-20 DIAGNOSIS — S90562A Insect bite (nonvenomous), left ankle, initial encounter: Secondary | ICD-10-CM | POA: Diagnosis not present

## 2021-07-20 LAB — BASIC METABOLIC PANEL
Anion gap: 6 (ref 5–15)
BUN: 11 mg/dL (ref 6–20)
CO2: 26 mmol/L (ref 22–32)
Calcium: 9.4 mg/dL (ref 8.9–10.3)
Chloride: 107 mmol/L (ref 98–111)
Creatinine, Ser: 0.96 mg/dL (ref 0.61–1.24)
GFR, Estimated: >60 mL/min (ref >60)
Glucose, Bld: 79 mg/dL (ref 70–99)
Potassium: 4 mmol/L (ref 3.5–5.1)
Sodium: 139 mmol/L (ref 135–145)

## 2021-07-20 LAB — CBC
HCT: 40.2 % (ref 39.0–52.0)
Hemoglobin: 13.3 g/dL (ref 13.0–17.0)
MCH: 32.4 pg (ref 26.0–34.0)
MCHC: 33.1 g/dL (ref 30.0–36.0)
MCV: 97.8 fL (ref 80.0–100.0)
Platelets: 249 10*3/uL (ref 150–400)
RBC: 4.11 MIL/uL — ABNORMAL LOW (ref 4.22–5.81)
RDW: 13.3 % (ref 11.5–15.5)
WBC: 10.6 10*3/uL — ABNORMAL HIGH (ref 4.0–10.5)
nRBC: 0 % (ref 0.0–0.2)

## 2021-07-20 MED ORDER — TRIAMCINOLONE ACETONIDE 0.1 % EX CREA: 1.0000 "application " | TOPICAL_CREAM | Freq: Two times a day (BID) | CUTANEOUS | 0 refills | Status: AC

## 2021-07-20 MED ORDER — DOXYCYCLINE HYCLATE 100 MG PO CAPS: 100.0000 mg | ORAL_CAPSULE | Freq: Two times a day (BID) | ORAL | 0 refills | Status: AC

## 2021-07-20 MED ORDER — CLINDAMYCIN HCL 300 MG PO CAPS: 300.0000 mg | ORAL_CAPSULE | Freq: Three times a day (TID) | ORAL | 0 refills | Status: DC

## 2021-07-20 NOTE — Discharge Instructions (Addendum)
Follow-up with your primary care provider if any continued problems or concerns or in urgent care.  If any severe worsening of this area, fever, chills, nausea or vomiting return to the emergency department.  Begin taking antibiotics 3 times a day for the next 7 days and use the triamcinolone cream to avoid scratching the area.  You may take Tylenol or ibuprofen as needed for pain and elevate your leg.

## 2021-07-20 NOTE — ED Triage Notes (Signed)
Pt here with a spider bite to the inside of his left ankle. Pt first noticed the bite on Wed evening. Bite is red and inflamed and circled by a permanent marker. Pt states bite is painful and itchy. Pt denies draining.

## 2021-07-20 NOTE — ED Provider Notes (Signed)
[facility] Provider Note    Event Date/Time   First MD Initiated Contact with Patient 07/20/21 [phone]     (approximate)   History   Insect Bite   HPI  Bernerd Terhune. is a 56 y.o. male   presents to the ED with complaint of an insect or spider bite to his left ankle that occurred yesterday.  Patient did not actually see anything bite him.  Area is red and inflamed and patient states it is extremely itchy and he has been scratching at it.  Now it has become painful and red.  Patient denies any fever.      Physical Exam   Triage Vital Signs: ED Triage Vitals  Enc Vitals Group     BP 07/20/21 0829 (!) 131/102     Pulse Rate 07/20/21 0828 77     Resp 07/20/21 0828 18     Temp 07/20/21 0828 97.8 F (36.6 C)     Temp Source 07/20/21 0828 Oral     SpO2 07/20/21 0828 100 %     Weight 07/20/21 0828 150 lb (68 kg)     Height 07/20/21 0828 6' (1.829 m)     Head Circumference --      Peak Flow --      Pain Score 07/20/21 0828 9     Pain Loc --      Pain Edu? --      Excl. in [place]? --     Most recent vital signs: Vitals:   07/20/21 0829 07/20/21 0914  BP: (!) 131/102 (!) 127/91  Pulse:    Resp:    Temp:    SpO2:       General: Awake, no distress.  CV:  Good peripheral perfusion.  Resp:  Normal effort.  Abd:  No distention.  Other:  Examination of the left ankle there is erythema confined to the medial aspect of his ankle.  Also signs of excoriation where patient has been scratching.  No drainage is present.  Area is tender to palpation.  Patient is able move digits distally.   ED Results / Procedures / Treatments   Labs (all labs ordered are listed, but only abnormal results are displayed) Labs Reviewed  CBC - Abnormal; Notable for the following components:      Result Value   WBC 10.6 (*)    RBC 4.11 (*)    All other components within normal limits  BASIC METABOLIC PANEL      PROCEDURES:  Critical Care performed:    Procedures   MEDICATIONS ORDERED IN ED: Medications - No data to display   IMPRESSION / MDM / [place] / ED COURSE  I reviewed the triage vital signs and the nursing notes.   Differential diagnosis includes, but is not limited to, contact dermatitis, insect bite, spider bite, cellulitis.  56 year old male presents to the ED after being bit by either an insect or spider on yesterday.  Patient states that is itchy and now has become painful.  He has been scratching it quite a bit and now the area is red and inflamed.  He denies any fever, chills or nausea.  Area does appear to be getting infected and nurse used a marker to define the edges of this.  Patient was given a prescription for triamcinolone cream to apply to the area twice daily as needed for itching.  He currently has taken Benadryl which she can continue.  Clindamycin 300 mg 3  times daily for 7 days was sent to the pharmacy for patient to begin taking.  He is aware that should he develop any fever, chills or increased swelling he is to return to the emergency department.    FINAL CLINICAL IMPRESSION(S) / ED DIAGNOSES   Final diagnoses:  Bug bite with infection, initial encounter     Rx / DC Orders   ED Discharge Orders          Ordered    triamcinolone cream (KENALOG) 0.1 %  2 times daily        07/20/21 0918    clindamycin (CLEOCIN) 300 MG capsule  3 times daily        07/20/21 4008             Note:  This document was prepared using Dragon voice recognition software and may include unintentional dictation errors.   Johnn Hai, PA-C 07/20/21 1422    Harvest Dark, MD 07/20/21 1504

## 2021-07-20 NOTE — Telephone Encounter (Signed)
Pt reports side effects to clindamycin - nausea. Will change to doxycycline

## 2021-10-26 ENCOUNTER — Emergency Department: Payer: 59

## 2021-10-26 ENCOUNTER — Other Ambulatory Visit: Payer: Self-pay

## 2021-10-26 ENCOUNTER — Emergency Department
Admission: EM | Admit: 2021-10-26 | Discharge: 2021-10-26 | Disposition: A | Discharge status: Home / Self Care | Payer: 59 | Attending: Emergency Medicine | Admitting: Emergency Medicine

## 2021-10-26 ENCOUNTER — Encounter: Payer: Self-pay | Admitting: Emergency Medicine

## 2021-10-26 DIAGNOSIS — R0602 Shortness of breath: Secondary | ICD-10-CM | POA: Insufficient documentation

## 2021-10-26 DIAGNOSIS — J441 Chronic obstructive pulmonary disease with (acute) exacerbation: Secondary | ICD-10-CM

## 2021-10-26 DIAGNOSIS — R55 Syncope and collapse: Secondary | ICD-10-CM | POA: Diagnosis not present

## 2021-10-26 DIAGNOSIS — R079 Chest pain, unspecified: Secondary | ICD-10-CM | POA: Insufficient documentation

## 2021-10-26 DIAGNOSIS — J439 Emphysema, unspecified: Secondary | ICD-10-CM | POA: Diagnosis not present

## 2021-10-26 DIAGNOSIS — R0789 Other chest pain: Secondary | ICD-10-CM | POA: Diagnosis not present

## 2021-10-26 DIAGNOSIS — J449 Chronic obstructive pulmonary disease, unspecified: Secondary | ICD-10-CM | POA: Diagnosis not present

## 2021-10-26 DIAGNOSIS — R69 Illness, unspecified: Secondary | ICD-10-CM | POA: Diagnosis not present

## 2021-10-26 LAB — TROPONIN I (HIGH SENSITIVITY): Troponin I (High Sensitivity): 4 ng/L (ref <18)

## 2021-10-26 LAB — BASIC METABOLIC PANEL
Anion gap: 8 (ref 5–15)
BUN: 13 mg/dL (ref 6–20)
CO2: 26 mmol/L (ref 22–32)
Calcium: 9.2 mg/dL (ref 8.9–10.3)
Chloride: 105 mmol/L (ref 98–111)
Creatinine, Ser: 0.94 mg/dL (ref 0.61–1.24)
GFR, Estimated: >60 mL/min (ref >60)
Glucose, Bld: 152 mg/dL — ABNORMAL HIGH (ref 70–99)
Potassium: 4.4 mmol/L (ref 3.5–5.1)
Sodium: 139 mmol/L (ref 135–145)

## 2021-10-26 LAB — CBC
HCT: 42.3 % (ref 39.0–52.0)
Hemoglobin: 14.2 g/dL (ref 13.0–17.0)
MCH: 32.4 pg (ref 26.0–34.0)
MCHC: 33.6 g/dL (ref 30.0–36.0)
MCV: 96.6 fL (ref 80.0–100.0)
Platelets: 229 10*3/uL (ref 150–400)
RBC: 4.38 MIL/uL (ref 4.22–5.81)
RDW: 12.2 % (ref 11.5–15.5)
WBC: 9.7 10*3/uL (ref 4.0–10.5)
nRBC: 0 % (ref 0.0–0.2)

## 2021-10-26 MED ORDER — ALBUTEROL SULFATE HFA 108 (90 BASE) MCG/ACT IN AERS: 2.0000 | INHALATION_SPRAY | Freq: Four times a day (QID) | RESPIRATORY_TRACT | 2 refills | Status: AC | PRN

## 2021-10-26 NOTE — ED Provider Notes (Signed)
[facility] Provider Note    Event Date/Time   First MD Initiated Contact with Patient 10/26/21 [phone]     (approximate)  History   Chief Complaint: Chest Pain and Shortness of Breath  HPI  Clarence Long. is a 56 y.o. male with a past medical history of intermittent chest pain, presents to the emergency department for chest pain.  According to the patient for the past 3 days he has been experiencing intermittent pain in his chest.  Denies any nausea vomiting diarrhea diaphoresis.  Does state occasional shortness of breath.  Patient states a longstanding history of chest pain but he was concerned so he came to the emergency department for evaluation.  Patient denies any prior cardiac disease denies any cardiac stents or heart attacks.  Has never had a stress test or seen a cardiologist.  Patient does smoke cigarettes daily but is trying to cut back.  Physical Exam   Triage Vital Signs: ED Triage Vitals  Enc Vitals Group     BP 10/26/21 0810 (!) 127/91     Pulse Rate 10/26/21 0810 76     Resp 10/26/21 0810 14     Temp 10/26/21 0810 97.8 F (36.6 C)     Temp src --      SpO2 10/26/21 0810 100 %     Weight 10/26/21 0806 149 lb 14.6 oz (68 kg)     Height 10/26/21 0806 6' (1.829 m)     Head Circumference --      Peak Flow --      Pain Score 10/26/21 0806 9     Pain Loc --      Pain Edu? --      Excl. in [place]? --     Most recent vital signs: Vitals:   10/26/21 0810 10/26/21 0939  BP: (!) 127/91 130/88  Pulse: 76 80  Resp: 14 16  Temp: 97.8 F (36.6 C)   SpO2: 100% 100%    General: Awake, no distress.  CV:  Good peripheral perfusion.  Regular rate and rhythm  Resp:  Normal effort.  Equal breath sounds bilaterally.  Abd:  No distention.  Soft, nontender.  No rebound or guarding.    ED Results / Procedures / Treatments   EKG  EKG viewed and interpreted by myself shows a normal sinus rhythm at 77 bpm with a narrow QRS, normal axis, normal  intervals, no concerning ST changes.  RADIOLOGY  I have reviewed and interpreted the chest x-ray images.  Chest x-ray appears most consistent with COPD but no consolidation seen on my evaluation. Radiology is read the chest x-ray as COPD without acute infiltrate.   MEDICATIONS ORDERED IN ED: Medications - No data to display   IMPRESSION / MDM / [place] / ED COURSE  I reviewed the triage vital signs and the nursing notes.  Patient's presentation is most consistent with acute presentation with potential threat to life or bodily function.  Patient presents to the emergency department for intermittent chest pain also states intermittent shortness of breath.  Overall the patient appears well, reassuring work-up, reassuring vitals.  Patient's physical exam is reassuring as well.  Work-up shows normal labs including CBC largely reassuring chemistry negative troponin.  X-ray is clear but does show signs of COPD.  Patient denies any chest discomfort currently but does states some shortness of breath and states at times he feels like he needs to take a deep breath.  Patient likely has  undiagnosed COPD.  Given the patient's reassuring work-up I believe the patient is safe for discharge home but we will prescribe albuterol for the patient to be used if needed.  We will also refer to a cardiologist for consideration of a stress test.  I provided my normal chest pain return precautions.  Patient agreeable to plan.  FINAL CLINICAL IMPRESSION(S) / ED DIAGNOSES   Chest pain  Rx / DC Orders   Albuterol inhaler  Note:  This document was prepared using Dragon voice recognition software and may include unintentional dictation errors.   Harvest Dark, MD 10/26/21 [phone]

## 2021-10-26 NOTE — ED Triage Notes (Signed)
Pt here with cp since Tuesday night. Pt states pain is left sided and radiates to his arm neck and back. Pt denies N/V/D.

## 2021-10-27 DIAGNOSIS — I472 Ventricular tachycardia, unspecified: Secondary | ICD-10-CM | POA: Diagnosis not present

## 2021-10-27 DIAGNOSIS — I110 Hypertensive heart disease with heart failure: Secondary | ICD-10-CM | POA: Diagnosis not present

## 2021-10-27 DIAGNOSIS — R69 Illness, unspecified: Secondary | ICD-10-CM | POA: Diagnosis not present

## 2021-10-27 DIAGNOSIS — R0602 Shortness of breath: Secondary | ICD-10-CM | POA: Diagnosis not present

## 2021-10-27 DIAGNOSIS — Z720 Tobacco use: Secondary | ICD-10-CM | POA: Diagnosis not present

## 2021-10-27 DIAGNOSIS — J449 Chronic obstructive pulmonary disease, unspecified: Secondary | ICD-10-CM | POA: Diagnosis not present

## 2021-10-27 DIAGNOSIS — I509 Heart failure, unspecified: Secondary | ICD-10-CM | POA: Diagnosis not present

## 2021-10-27 DIAGNOSIS — R55 Syncope and collapse: Secondary | ICD-10-CM | POA: Diagnosis not present

## 2021-10-27 DIAGNOSIS — R079 Chest pain, unspecified: Secondary | ICD-10-CM | POA: Diagnosis not present

## 2022-08-10 ENCOUNTER — Other Ambulatory Visit: Payer: Self-pay

## 2022-08-10 ENCOUNTER — Encounter: Payer: Self-pay | Admitting: Emergency Medicine

## 2022-08-10 ENCOUNTER — Emergency Department
Admission: EM | Admit: 2022-08-10 | Discharge: 2022-08-10 | Disposition: A | Discharge status: Home / Self Care | Payer: BLUE CROSS/BLUE SHIELD | Attending: Emergency Medicine | Admitting: Emergency Medicine

## 2022-08-10 DIAGNOSIS — R112 Nausea with vomiting, unspecified: Secondary | ICD-10-CM | POA: Diagnosis present

## 2022-08-10 DIAGNOSIS — D72829 Elevated white blood cell count, unspecified: Secondary | ICD-10-CM | POA: Insufficient documentation

## 2022-08-10 DIAGNOSIS — A084 Viral intestinal infection, unspecified: Secondary | ICD-10-CM

## 2022-08-10 LAB — CBC WITH DIFFERENTIAL/PLATELET
Abs Immature Granulocytes: 0.05 10*3/uL (ref 0.00–0.07)
Basophils Absolute: 0.1 10*3/uL (ref 0.0–0.1)
Basophils Relative: 1 %
Eosinophils Absolute: 0 10*3/uL (ref 0.0–0.5)
Eosinophils Relative: 0 %
HCT: 44.5 % (ref 39.0–52.0)
Hemoglobin: 14.7 g/dL (ref 13.0–17.0)
Immature Granulocytes: 0 %
Lymphocytes Relative: 11 %
Lymphs Abs: 1.2 10*3/uL (ref 0.7–4.0)
MCH: 32.6 pg (ref 26.0–34.0)
MCHC: 33 g/dL (ref 30.0–36.0)
MCV: 98.7 fL (ref 80.0–100.0)
Monocytes Absolute: 0.7 10*3/uL (ref 0.1–1.0)
Monocytes Relative: 6 %
Neutro Abs: 9.2 10*3/uL — ABNORMAL HIGH (ref 1.7–7.7)
Neutrophils Relative %: 82 %
Platelets: 258 10*3/uL (ref 150–400)
RBC: 4.51 MIL/uL (ref 4.22–5.81)
RDW: 12.8 % (ref 11.5–15.5)
WBC: 11.4 10*3/uL — ABNORMAL HIGH (ref 4.0–10.5)
nRBC: 0 % (ref 0.0–0.2)

## 2022-08-10 LAB — COMPREHENSIVE METABOLIC PANEL
ALT: 27 U/L (ref 0–44)
AST: 16 U/L (ref 15–41)
Albumin: 4.6 g/dL (ref 3.5–5.0)
Alkaline Phosphatase: 77 U/L (ref 38–126)
Anion gap: 7 (ref 5–15)
BUN: 18 mg/dL (ref 6–20)
CO2: 26 mmol/L (ref 22–32)
Calcium: 9.4 mg/dL (ref 8.9–10.3)
Chloride: 102 mmol/L (ref 98–111)
Creatinine, Ser: 1.39 mg/dL — ABNORMAL HIGH (ref 0.61–1.24)
GFR, Estimated: 60 mL/min — ABNORMAL LOW (ref >60)
Glucose, Bld: 112 mg/dL — ABNORMAL HIGH (ref 70–99)
Potassium: 4.4 mmol/L (ref 3.5–5.1)
Sodium: 135 mmol/L (ref 135–145)
Total Bilirubin: 0.7 mg/dL (ref 0.3–1.2)
Total Protein: 8.1 g/dL (ref 6.5–8.1)

## 2022-08-10 LAB — LIPASE, BLOOD: Lipase: 30 U/L (ref 11–51)

## 2022-08-10 LAB — TROPONIN I (HIGH SENSITIVITY): Troponin I (High Sensitivity): 5 ng/L (ref <18)

## 2022-08-10 MED ORDER — ONDANSETRON 4 MG PO TBDP: 4.0000 mg | ORAL_TABLET | Freq: Three times a day (TID) | ORAL | 0 refills | Status: DC | PRN

## 2022-08-10 MED ORDER — SODIUM CHLORIDE 0.9 % IV BOLUS
1000.0000 mL | Freq: Once | INTRAVENOUS | Status: AC
  Administered 2022-08-10: 1000 mL via INTRAVENOUS

## 2022-08-10 MED ORDER — ONDANSETRON HCL 4 MG/2ML IJ SOLN
4.0000 mg | Freq: Once | INTRAMUSCULAR | Status: AC
  Administered 2022-08-10: 4 mg via INTRAVENOUS
  Filled 2022-08-10: qty 2

## 2022-08-10 NOTE — ED Provider Notes (Signed)
[facility] Provider Note    Event Date/Time   First MD Initiated Contact with Patient 08/10/22 [phone]     (approximate)   History   Dizziness   HPI  Clarence Long. is a 57 y.o. male  Atrial tachycardia, Hepatitis C, and as listed in EMR presents to the emergency department for treatment and evaluation of nausea, vomiting, dizziness, and diarrhea. Symptoms started yesterday. No fever. No known exposure.      Physical Exam   Triage Vital Signs: ED Triage Vitals  Enc Vitals Group     BP 08/10/22 0419 107/80     Pulse Rate 08/10/22 0419 81     Resp 08/10/22 0419 16     Temp 08/10/22 0419 98 F (36.7 C)     Temp src --      SpO2 08/10/22 0419 100 %     Weight 08/10/22 0416 150 lb (68 kg)     Height 08/10/22 0416 6' (1.829 m)     Head Circumference --      Peak Flow --      Pain Score 08/10/22 0416 0     Pain Loc --      Pain Edu? --      Excl. in [place]? --     Most recent vital signs: Vitals:   08/10/22 0900 08/10/22 0905  BP:  (!) 139/94  Pulse:    Resp: (!) 21 18  Temp:    SpO2:      General: Awake, no distress.  CV:  Good peripheral perfusion.  Resp:  Normal effort.  Abd:  No distention. No focal tenderness. Other:     ED Results / Procedures / Treatments   Labs (all labs ordered are listed, but only abnormal results are displayed) Labs Reviewed  COMPREHENSIVE METABOLIC PANEL - Abnormal; Notable for the following components:      Result Value   Glucose, Bld 112 (*)    Creatinine, Ser 1.39 (*)    GFR, Estimated 60 (*)    All other components within normal limits  CBC WITH DIFFERENTIAL/PLATELET - Abnormal; Notable for the following components:   WBC 11.4 (*)    Neutro Abs 9.2 (*)    All other components within normal limits  LIPASE, BLOOD  TROPONIN I (HIGH SENSITIVITY)     EKG  NSR, rate of 82   RADIOLOGY  Image and radiology report reviewed and interpreted by me. Radiology report consistent with the  same.  Not indicated  PROCEDURES:  Critical Care performed: No  Procedures   MEDICATIONS ORDERED IN ED:  Medications  sodium chloride 0.9 % bolus 1,000 mL (0 mLs Intravenous Stopped 08/10/22 0900)  ondansetron (ZOFRAN) injection 4 mg (4 mg Intravenous Given 08/10/22 0758)     IMPRESSION / MDM / ASSESSMENT AND PLAN / ED COURSE   I have reviewed the triage note.  Differential diagnosis includes, but is not limited to, viral syndrome, colitis   Patient's presentation is most consistent with acute illness / injury with system symptoms.  57 year old male presenting to the emergency department for treatment and evaluation of nausea, vomiting, diarrhea, and dizziness.  See HPI for further details.  Labs show a very mild leukocytosis of 11.4 but are otherwise unremarkable.  Patient improved after 1 L of normal saline and Zofran.  Dizziness resolved.  And he had no additional vomiting or diarrhea.  He is able to tolerate ice chips.  Discharged home with instructions to follow-up with primary  care or return to the emergency department for symptoms change or worsen.      FINAL CLINICAL IMPRESSION(S) / ED DIAGNOSES   Final diagnoses:  Viral gastroenteritis     Rx / DC Orders   ED Discharge Orders          Ordered    ondansetron (ZOFRAN-ODT) 4 MG disintegrating tablet  Every 8 hours PRN        08/10/22 0848             Note:  This document was prepared using Dragon voice recognition software and may include unintentional dictation errors.   Chinita Pester, FNP 08/11/22 7425    Merwyn Katos, MD 08/11/22 [phone]

## 2022-08-10 NOTE — Discharge Instructions (Addendum)
Bland foods today then increase to your normal diet slowly.

## 2022-08-10 NOTE — ED Triage Notes (Signed)
Patient ambulatory to triage with steady gait, without difficulty or distress noted; pt reports since yesterday having dizziness, nausea and diarrhea; denies any pain, denies hx of same

## 2023-06-08 ENCOUNTER — Emergency Department
Admission: EM | Admit: 2023-06-08 | Discharge: 2023-06-08 | Disposition: A | Discharge status: Home / Self Care | Attending: Emergency Medicine | Admitting: Emergency Medicine

## 2023-06-08 ENCOUNTER — Other Ambulatory Visit: Payer: Self-pay

## 2023-06-08 DIAGNOSIS — R197 Diarrhea, unspecified: Secondary | ICD-10-CM | POA: Diagnosis not present

## 2023-06-08 DIAGNOSIS — R112 Nausea with vomiting, unspecified: Secondary | ICD-10-CM | POA: Diagnosis present

## 2023-06-08 DIAGNOSIS — D72829 Elevated white blood cell count, unspecified: Secondary | ICD-10-CM | POA: Insufficient documentation

## 2023-06-08 LAB — LIPASE, BLOOD: Lipase: 27 U/L (ref 11–51)

## 2023-06-08 LAB — COMPREHENSIVE METABOLIC PANEL WITH GFR
ALT: 25 U/L (ref 0–44)
AST: 13 U/L — ABNORMAL LOW (ref 15–41)
Albumin: 4.3 g/dL (ref 3.5–5.0)
Alkaline Phosphatase: 66 U/L (ref 38–126)
Anion gap: 10 (ref 5–15)
BUN: 16 mg/dL (ref 6–20)
CO2: 24 mmol/L (ref 22–32)
Calcium: 9.5 mg/dL (ref 8.9–10.3)
Chloride: 104 mmol/L (ref 98–111)
Creatinine, Ser: 0.96 mg/dL (ref 0.61–1.24)
GFR, Estimated: >60 mL/min (ref >60)
Glucose, Bld: 100 mg/dL — ABNORMAL HIGH (ref 70–99)
Potassium: 4.5 mmol/L (ref 3.5–5.1)
Sodium: 138 mmol/L (ref 135–145)
Total Bilirubin: 0.6 mg/dL (ref 0.0–1.2)
Total Protein: 7.8 g/dL (ref 6.5–8.1)

## 2023-06-08 LAB — URINALYSIS, ROUTINE W REFLEX MICROSCOPIC
Bilirubin Urine: NEGATIVE
Glucose, UA: NEGATIVE mg/dL
Hgb urine dipstick: NEGATIVE
Ketones, ur: NEGATIVE mg/dL
Leukocytes,Ua: NEGATIVE
Nitrite: NEGATIVE
Protein, ur: NEGATIVE mg/dL
Specific Gravity, Urine: 1.017 (ref 1.005–1.030)
pH: 6 (ref 5.0–8.0)

## 2023-06-08 LAB — CBC
HCT: 42 % (ref 39.0–52.0)
Hemoglobin: 14.2 g/dL (ref 13.0–17.0)
MCH: 32.8 pg (ref 26.0–34.0)
MCHC: 33.8 g/dL (ref 30.0–36.0)
MCV: 97 fL (ref 80.0–100.0)
Platelets: 242 10*3/uL (ref 150–400)
RBC: 4.33 MIL/uL (ref 4.22–5.81)
RDW: 12.5 % (ref 11.5–15.5)
WBC: 16.4 10*3/uL — ABNORMAL HIGH (ref 4.0–10.5)
nRBC: 0 % (ref 0.0–0.2)

## 2023-06-08 MED ORDER — ONDANSETRON HCL 4 MG/2ML IJ SOLN
4.0000 mg | INTRAMUSCULAR | Status: AC
  Administered 2023-06-08: 4 mg via INTRAVENOUS
  Filled 2023-06-08: qty 2

## 2023-06-08 MED ORDER — SODIUM CHLORIDE 0.9 % IV BOLUS
500.0000 mL | Freq: Once | INTRAVENOUS | Status: AC
  Administered 2023-06-08: 500 mL via INTRAVENOUS

## 2023-06-08 MED ORDER — ONDANSETRON 4 MG PO TBDP: ORAL_TABLET | ORAL | 0 refills | Status: AC

## 2023-06-08 NOTE — ED Provider Notes (Signed)
 [facility] Provider Note    Event Date/Time   First MD Initiated Contact with Patient 06/08/23 [phone]     (approximate)   History   Emesis and Diarrhea   HPI Clarence Long. is a 58 y.o. male whose medical history includes aortic stenosis, hepatitis C, nicotine abuse, and prior cocaine use.  He presents for evaluation of persistent nausea, vomiting, and diarrhea since Tuesday (approximately 5 days).  It started suddenly and there was not anything in particular that he believes caused it, such as a bad food exposure.  No one around him has been ill.  He has not taken any recent antibiotics or started or discontinued any medications.  He has not had any alcohol over the last week and denies drug use.  He denies abdominal pain, just reports that he has been vomiting a lot and having loose watery, nonbloody stools.  Nothing in particular seems to make it better or worse.  He was working third shift tonight and vomited multiple times so he came here for evaluation.  He said he continues to have no pain but still feels a little bit nauseated.  Eating seems to make it a bit worse.     Physical Exam   Triage Vital Signs: ED Triage Vitals  Encounter Vitals Group     BP 06/08/23 0414 119/86     Systolic BP Percentile --      Diastolic BP Percentile --      Pulse Rate 06/08/23 0414 82     Resp 06/08/23 0414 17     Temp 06/08/23 0414 98.5 F (36.9 C)     Temp Source 06/08/23 0414 Oral     SpO2 06/08/23 0414 100 %     Weight 06/08/23 0417 68 kg (150 lb)     Height 06/08/23 0417 1.854 m (6\' 1" )     Head Circumference --      Peak Flow --      Pain Score --      Pain Loc --      Pain Education --      Exclude from Growth Chart --     Most recent vital signs: Vitals:   06/08/23 0414  BP: 119/86  Pulse: 82  Resp: 17  Temp: 98.5 F (36.9 C)  SpO2: 100%    General: Awake, alert, appears older than chronological age but in no acute  distress. CV:  Good peripheral perfusion.  Regular rate and rhythm. Resp:  Normal effort. Speaking easily and comfortably, no accessory muscle usage nor intercostal retractions.   Abd:  No distention.  Soft, no tenderness to palpation with no guarding.   ED Results / Procedures / Treatments   Labs (all labs ordered are listed, but only abnormal results are displayed) Labs Reviewed  COMPREHENSIVE METABOLIC PANEL WITH GFR - Abnormal; Notable for the following components:      Result Value   Glucose, Bld 100 (*)    AST 13 (*)    All other components within normal limits  CBC - Abnormal; Notable for the following components:   WBC 16.4 (*)    All other components within normal limits  URINALYSIS, ROUTINE W REFLEX MICROSCOPIC - Abnormal; Notable for the following components:   Color, Urine YELLOW (*)    APPearance HAZY (*)    All other components within normal limits  LIPASE, BLOOD     PROCEDURES:  Critical Care performed: No  Procedures    IMPRESSION /  MDM / ASSESSMENT AND PLAN / ED COURSE  I reviewed the triage vital signs and the nursing notes.                              Differential diagnosis includes, but is not limited to, viral gastroenteritis, bacterial gastroenteritis, foodborne pathogen, SBO/ileus, pancreatitis, nonspecific gastritis, medication or drug side effect.  Patient's presentation is most consistent with acute presentation with potential threat to life or bodily function.  Labs/studies ordered: Comprehensive metabolic panel, lipase, urinalysis, CBC  Interventions/Medications given:  Medications  ondansetron  (ZOFRAN ) injection 4 mg (4 mg Intravenous Given 06/08/23 0513)  sodium chloride  0.9 % bolus 500 mL (500 mLs Intravenous New Bag/Given 06/08/23 0513)    (Note:  hospital course my include additional interventions and/or labs/studies not listed above.)   Patient's vital signs are stable and within normal limits.  Labs are notable for leukocytosis  of 16.4 which is nonspecific in the setting of the vomiting and diarrhea.  He has no other signs of acute infection.  I went into the room anticipating that I would get a CT scan, but he has absolutely no tenderness to palpation of his abdomen and no guarding.  At this point I think that imaging would be of limited utility given that he reports never having any abdominal pain and currently not having any pain.  I will give Zofran  4 mg IV and a 500 mL normal saline bolus and reassess.  Will try a p.o. challenge to see if he continues to vomit.  Patient understands and agrees with the plan.     Clinical Course as of 06/08/23 0641  Sat Jun 08, 2023  0639 I reassessed the patient and he said he feels better.  He has tolerated a p.o. challenge and has no more nausea.  Continues to have no abdominal pain.  Will discharge with prescription listed below and my usual customary follow-up recommendations and return precautions, including information about how to establish a PCP. [CF]    Clinical Course User Index [CF] Lynnda Sas, MD     FINAL CLINICAL IMPRESSION(S) / ED DIAGNOSES   Final diagnoses:  Nausea vomiting and diarrhea     Rx / DC Orders   ED Discharge Orders          Ordered    ondansetron  (ZOFRAN -ODT) 4 MG disintegrating tablet        06/08/23 0455             Note:  This document was prepared using Dragon voice recognition software and may include unintentional dictation errors.   Lynnda Sas, MD 06/08/23 [phone]

## 2023-06-08 NOTE — ED Triage Notes (Signed)
 Patient C/O vomiting and diarrhea that began on Tuesday. Patient denies fevers, abdominal pain, or urinary symptoms at this time.

## 2023-06-08 NOTE — Discharge Instructions (Signed)
 We believe your symptoms are caused by either a viral infection or possibly a bad food exposure.  Either way, since your symptoms have improved, we feel it is safe for you to go home and follow up with your regular doctor.  Please read the included information and stick to a bland diet for the next two days.  Drink plenty of clear fluids, and if you were provided with a prescription, please take it according to the label instructions.    If you develop any new or worsening symptoms, including persistent vomiting not controlled with medication, fever greater than 101, severe or worsening abdominal pain, or other symptoms that concern you, please return immediately to the Emergency Department.

## 2023-09-19 ENCOUNTER — Emergency Department

## 2023-09-19 ENCOUNTER — Emergency Department
Admission: EM | Admit: 2023-09-19 | Discharge: 2023-09-19 | Disposition: A | Discharge status: Home / Self Care | Attending: Emergency Medicine | Admitting: Emergency Medicine

## 2023-09-19 ENCOUNTER — Other Ambulatory Visit: Payer: Self-pay

## 2023-09-19 DIAGNOSIS — M5412 Radiculopathy, cervical region: Secondary | ICD-10-CM | POA: Diagnosis not present

## 2023-09-19 DIAGNOSIS — M25511 Pain in right shoulder: Secondary | ICD-10-CM | POA: Diagnosis present

## 2023-09-19 MED ORDER — KETOROLAC TROMETHAMINE 15 MG/ML IJ SOLN
15.0000 mg | Freq: Once | INTRAMUSCULAR | Status: AC
  Administered 2023-09-19: 15 mg via INTRAMUSCULAR
  Filled 2023-09-19: qty 1

## 2023-09-19 MED ORDER — NAPROXEN 500 MG PO TABS: 500.0000 mg | ORAL_TABLET | Freq: Two times a day (BID) | ORAL | 0 refills | Status: AC

## 2023-09-19 MED ORDER — PREDNISONE 10 MG (21) PO TBPK: ORAL_TABLET | ORAL | 0 refills | Status: AC

## 2023-09-19 NOTE — ED Notes (Addendum)
 See triage note  Presents s/p fall    States he slipped 2 days ago  Landed on right shoulder States he is having pain post right shoulder  States pain moves into right arm  No deformity noted

## 2023-09-19 NOTE — Discharge Instructions (Addendum)
 Please follow-up with Dr. Katrina with neurosurgery.  Take the medication as prescribed. Remember not to take the naproxen  with any other NSAIDs.  Please return if you develop weakness in your arm, or any new, worsening, or change in symptoms or other concerns.  It was a pleasure caring for you today.

## 2023-09-19 NOTE — ED Provider Notes (Signed)
 [facility] Provider Note    Event Date/Time   First MD Initiated Contact with Patient 09/19/23 [phone]     (approximate)   History   Fall   HPI  Clarence Long. is a 58 y.o. male who presents today for evaluation of shoulder and arm pain after a fall that occurred 2 days ago.  Patient reports he was carrying a heavy box when he left and fell onto his right outstretched hand and landed on his right shoulder.  There was no head strike or LOC.  He reports that he has had pain in his right shoulder radiating down to his wrist ever since.  He is still able to move his arm.  He has not had any weakness.  He feels paresthesias radiating down his arm.  Denies decreased grip strength.  He is not anticoagulated.  No chest pain or shortness of breath.  Patient Active Problem List   Diagnosis Date Noted   Cocaine abuse (HCC) 06/06/2021   Non-recurrent bilateral inguinal hernia without obstruction or gangrene 04/11/2017   Hepatitis C 04/11/2017   Atrial tachycardia, paroxysmal (HCC) 06/15/2013   Calcific aortic stenosis of bicuspid valve 06/15/2013   Bicuspid aortic valve 05/26/2013   Chest pain of uncertain etiology 05/26/2013   Nicotine abuse 05/26/2013          Physical Exam   Triage Vital Signs: ED Triage Vitals  Encounter Vitals Group     BP 09/19/23 0853 (!) 121/101     Girls Systolic BP Percentile --      Girls Diastolic BP Percentile --      Boys Systolic BP Percentile --      Boys Diastolic BP Percentile --      Pulse Rate 09/19/23 0853 82     Resp 09/19/23 0853 18     Temp 09/19/23 0855 97.9 F (36.6 C)     Temp Source 09/19/23 0853 Oral     SpO2 09/19/23 0853 98 %     Weight 09/19/23 0854 148 lb (67.1 kg)     Height 09/19/23 0854 6' (1.829 m)     Head Circumference --      Peak Flow --      Pain Score 09/19/23 0854 10     Pain Loc --      Pain Education --      Exclude from Growth Chart --     Most recent vital signs: Vitals:    09/19/23 0855 09/19/23 1109  BP:  (!) 130/91  Pulse:  80  Resp:  16  Temp: 97.9 F (36.6 C)   SpO2:  98%    Physical Exam Vitals and nursing note reviewed.  Constitutional:      General: Awake and alert. No acute distress.    Appearance: Normal appearance. The patient is normal weight.  HENT:     Head: Normocephalic and atraumatic.     Mouth: Mucous membranes are moist.  Eyes:     General: PERRL. Normal EOMs        Right eye: No discharge.        Left eye: No discharge.     Conjunctiva/sclera: Conjunctivae normal.  Cardiovascular:     Rate and Rhythm: Normal rate and regular rhythm.     Pulses: Normal pulses.  Pulmonary:     Effort: Pulmonary effort is normal. No respiratory distress.     Breath sounds: Normal breath sounds.  Abdominal:     Abdomen is soft.  There is no abdominal tenderness. No rebound or guarding. No distention. Musculoskeletal:        General: No swelling. Normal range of motion.     Cervical back: Normal range of motion and neck supple. No midline cervical spine tenderness.  Full range of motion of neck.  Pain with Spurling test. Negative distraction test. Negative Lhermitte sign.  Negative Hoffman sign. Normal strength and sensation in bilateral upper extremities. Normal grip strength bilaterally.  Normal intrinsic muscle function of the hand bilaterally.  Normal radial pulses bilaterally. Right shoulder: No obvious deformity, swelling, ecchymosis, or erythema No clavicular or AC joint tenderness Able to actively and passively forward flex and abduct at shoulder fully, negative drop arm test Negative Obriens, SLAP, empty can,though pain with these manuevers Normal internal and external rotation against resistance Negative Hawkins and Neers Normal ROM at elbow and wrist Normal resisted pronation and supination 2+ radial pulse Normal grip strength Normal intrinsic hand muscle function Skin:    General: Skin is warm and dry.     Capillary Refill:  Capillary refill takes less than 2 seconds.     Findings: No rash.  Neurological:     Mental Status: The patient is awake and alert.      ED Results / Procedures / Treatments   Labs (all labs ordered are listed, but only abnormal results are displayed) Labs Reviewed - No data to display   EKG     RADIOLOGY I independently reviewed and interpreted imaging and agree with radiologists findings.     PROCEDURES:  Critical Care performed:   Procedures   MEDICATIONS ORDERED IN ED: Medications  ketorolac  (TORADOL ) 15 MG/ML injection 15 mg (15 mg Intramuscular Given 09/19/23 0911)     IMPRESSION / MDM / ASSESSMENT AND PLAN / ED COURSE  I reviewed the triage vital signs and the nursing notes.   Differential diagnosis includes, but is not limited to, rotator cuff injury, cervical radiculopathy, SLAP tear, contusion, fracture, dislocation.  Patient is awake and alert, hemodynamically stable and afebrile.  He is nontoxic in appearance.  He has no midline cervical spine tenderness, though he does have reproduction of symptoms with Spurling test.  Negative Lhermitte sign, negative Hoffmann sign, negative distraction test.  He has pain with O'Brien's and SLAP tests, however he is able to perform these functions.  He has full and normal range of motion of his shoulder, though pain with range of motion.  X-ray of the shoulder obtained as well as CT of his neck for evaluation of shoulder injury versus cervical radiculopathy findings.  He was treated symptomatically with Toradol  with improvement of his symptoms.  X-ray of his shoulder was normal.  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  I suspect cervical radiculopathy is the most likely etiology of his symptoms today.  He has normal strength and sensation in bilateral upper extremities, normal grip strength bilaterally, feel is appropriate for outpatient  follow-up.  He was started on prednisone  Dosepak and naproxen  and given the appropriate follow-up information for neurosurgery.  He has not diabetic.  Patient is comfortable with this plan.  We also discussed return precautions.  He was discharged in stable condition.   Patient's presentation is most consistent with acute complicated illness / injury requiring diagnostic workup.       FINAL CLINICAL IMPRESSION(S) / ED DIAGNOSES   Final diagnoses:  Cervical radiculopathy     Rx / DC Orders   ED  Discharge Orders          Ordered    predniSONE  (STERAPRED UNI-PAK 21 TAB) 10 MG (21) TBPK tablet        09/19/23 1136    naproxen  (NAPROSYN ) 500 MG tablet  2 times daily with meals        09/19/23 1136             Note:  This document was prepared using Dragon voice recognition software and may include unintentional dictation errors.   Adelisa Satterwhite E, PA-C 09/19/23 1343    Levander Slate, MD 09/19/23 [phone]

## 2023-09-19 NOTE — ED Triage Notes (Signed)
 Pt to ED via POV from home. Pt reports 2-3 days ago was helping his brother move and was carrying something heavy and fell down a small hill. Pt reports pain to right shoulder/arm. No LOC or head trauma. No blood thinners.
